# Patient Record
Sex: Female | Born: 1955 | Race: White | Hispanic: No | Marital: Married | Smoking: Never smoker
Health system: Southern US, Community
[De-identification: ages and names within clinical notes are randomized; demographics above are authoritative.]

## PROBLEM LIST (undated history)

## (undated) DIAGNOSIS — M1990 Unspecified osteoarthritis, unspecified site: Secondary | ICD-10-CM

## (undated) DIAGNOSIS — G709 Myoneural disorder, unspecified: Secondary | ICD-10-CM

## (undated) DIAGNOSIS — T7840XA Allergy, unspecified, initial encounter: Secondary | ICD-10-CM

## (undated) DIAGNOSIS — K219 Gastro-esophageal reflux disease without esophagitis: Secondary | ICD-10-CM

## (undated) DIAGNOSIS — E079 Disorder of thyroid, unspecified: Secondary | ICD-10-CM

## (undated) DIAGNOSIS — F419 Anxiety disorder, unspecified: Secondary | ICD-10-CM

## (undated) DIAGNOSIS — IMO0002 Reserved for concepts with insufficient information to code with codable children: Secondary | ICD-10-CM

## (undated) DIAGNOSIS — F32A Depression, unspecified: Secondary | ICD-10-CM

## (undated) DIAGNOSIS — R011 Cardiac murmur, unspecified: Secondary | ICD-10-CM

## (undated) HISTORY — DX: Depression, unspecified: F32.A

## (undated) HISTORY — DX: Reserved for concepts with insufficient information to code with codable children: IMO0002

## (undated) HISTORY — DX: Disorder of thyroid, unspecified: E07.9

## (undated) HISTORY — PX: TUBAL LIGATION: SHX77

## (undated) HISTORY — DX: Allergy, unspecified, initial encounter: T78.40XA

## (undated) HISTORY — DX: Unspecified osteoarthritis, unspecified site: M19.90

## (undated) HISTORY — DX: Myoneural disorder, unspecified: G70.9

## (undated) HISTORY — DX: Cardiac murmur, unspecified: R01.1

## (undated) HISTORY — PX: APPENDECTOMY: SHX54

## (undated) HISTORY — DX: Anxiety disorder, unspecified: F41.9

## (undated) HISTORY — PX: OTHER SURGICAL HISTORY: SHX169

## (undated) HISTORY — DX: Gastro-esophageal reflux disease without esophagitis: K21.9

---

## 1993-01-21 DIAGNOSIS — K219 Gastro-esophageal reflux disease without esophagitis: Secondary | ICD-10-CM

## 1993-01-21 HISTORY — DX: Gastro-esophageal reflux disease without esophagitis: K21.9

## 1995-01-22 HISTORY — PX: ABDOMINAL HYSTERECTOMY: SHX81

## 1998-02-13 ENCOUNTER — Encounter: Admission: RE | Admit: 1998-02-13 | Discharge: 1998-02-22 | Payer: Self-pay | Admitting: *Deleted

## 1999-01-08 ENCOUNTER — Other Ambulatory Visit: Admission: RE | Admit: 1999-01-08 | Discharge: 1999-01-08 | Payer: Self-pay | Admitting: Obstetrics and Gynecology

## 2000-02-15 ENCOUNTER — Other Ambulatory Visit: Admission: RE | Admit: 2000-02-15 | Discharge: 2000-02-15 | Payer: Self-pay | Admitting: Obstetrics and Gynecology

## 2001-11-04 ENCOUNTER — Other Ambulatory Visit: Admission: RE | Admit: 2001-11-04 | Discharge: 2001-11-04 | Payer: Self-pay | Admitting: Obstetrics and Gynecology

## 2002-07-22 ENCOUNTER — Encounter: Admission: RE | Admit: 2002-07-22 | Discharge: 2002-07-22 | Payer: Self-pay | Admitting: Internal Medicine

## 2002-07-22 ENCOUNTER — Encounter: Payer: Self-pay | Admitting: Internal Medicine

## 2003-06-03 ENCOUNTER — Other Ambulatory Visit: Admission: RE | Admit: 2003-06-03 | Discharge: 2003-06-03 | Payer: Self-pay | Admitting: Obstetrics and Gynecology

## 2003-06-10 ENCOUNTER — Encounter: Admission: RE | Admit: 2003-06-10 | Discharge: 2003-06-10 | Payer: Self-pay | Admitting: Obstetrics and Gynecology

## 2004-09-17 ENCOUNTER — Other Ambulatory Visit: Admission: RE | Admit: 2004-09-17 | Discharge: 2004-09-17 | Payer: Self-pay | Admitting: Obstetrics and Gynecology

## 2004-11-14 ENCOUNTER — Ambulatory Visit: Payer: Self-pay | Admitting: Infectious Diseases

## 2005-02-22 ENCOUNTER — Ambulatory Visit: Payer: Self-pay | Admitting: Internal Medicine

## 2005-02-27 ENCOUNTER — Ambulatory Visit: Payer: Self-pay | Admitting: Family Medicine

## 2006-04-28 ENCOUNTER — Ambulatory Visit: Payer: Self-pay | Admitting: Internal Medicine

## 2006-05-29 DIAGNOSIS — Z9089 Acquired absence of other organs: Secondary | ICD-10-CM | POA: Insufficient documentation

## 2006-05-29 DIAGNOSIS — Z9079 Acquired absence of other genital organ(s): Secondary | ICD-10-CM | POA: Insufficient documentation

## 2006-07-04 ENCOUNTER — Encounter: Payer: Self-pay | Admitting: Internal Medicine

## 2006-09-05 ENCOUNTER — Telehealth (INDEPENDENT_AMBULATORY_CARE_PROVIDER_SITE_OTHER): Payer: Self-pay | Admitting: *Deleted

## 2006-10-02 ENCOUNTER — Telehealth (INDEPENDENT_AMBULATORY_CARE_PROVIDER_SITE_OTHER): Payer: Self-pay | Admitting: *Deleted

## 2006-12-03 ENCOUNTER — Telehealth (INDEPENDENT_AMBULATORY_CARE_PROVIDER_SITE_OTHER): Payer: Self-pay | Admitting: *Deleted

## 2007-01-28 ENCOUNTER — Ambulatory Visit: Payer: Self-pay | Admitting: Internal Medicine

## 2007-01-28 DIAGNOSIS — N951 Menopausal and female climacteric states: Secondary | ICD-10-CM | POA: Insufficient documentation

## 2007-01-28 DIAGNOSIS — R9431 Abnormal electrocardiogram [ECG] [EKG]: Secondary | ICD-10-CM | POA: Insufficient documentation

## 2007-01-28 DIAGNOSIS — F32A Depression, unspecified: Secondary | ICD-10-CM | POA: Insufficient documentation

## 2007-01-28 DIAGNOSIS — F329 Major depressive disorder, single episode, unspecified: Secondary | ICD-10-CM

## 2007-01-28 DIAGNOSIS — J309 Allergic rhinitis, unspecified: Secondary | ICD-10-CM | POA: Insufficient documentation

## 2007-04-09 ENCOUNTER — Encounter: Payer: Self-pay | Admitting: Internal Medicine

## 2007-04-09 ENCOUNTER — Ambulatory Visit: Payer: Self-pay | Admitting: Family Medicine

## 2007-04-27 ENCOUNTER — Encounter (INDEPENDENT_AMBULATORY_CARE_PROVIDER_SITE_OTHER): Payer: Self-pay | Admitting: *Deleted

## 2008-01-22 HISTORY — PX: COLONOSCOPY: SHX174

## 2008-02-24 ENCOUNTER — Telehealth (INDEPENDENT_AMBULATORY_CARE_PROVIDER_SITE_OTHER): Payer: Self-pay | Admitting: *Deleted

## 2008-03-21 ENCOUNTER — Telehealth (INDEPENDENT_AMBULATORY_CARE_PROVIDER_SITE_OTHER): Payer: Self-pay | Admitting: *Deleted

## 2008-04-25 ENCOUNTER — Ambulatory Visit: Payer: Self-pay | Admitting: Internal Medicine

## 2008-04-25 DIAGNOSIS — J4520 Mild intermittent asthma, uncomplicated: Secondary | ICD-10-CM | POA: Insufficient documentation

## 2008-04-25 DIAGNOSIS — J45909 Unspecified asthma, uncomplicated: Secondary | ICD-10-CM | POA: Insufficient documentation

## 2008-04-26 ENCOUNTER — Encounter: Payer: Self-pay | Admitting: Internal Medicine

## 2008-07-28 ENCOUNTER — Ambulatory Visit: Payer: Self-pay | Admitting: Family Medicine

## 2008-08-04 ENCOUNTER — Encounter: Payer: Self-pay | Admitting: Internal Medicine

## 2008-09-12 ENCOUNTER — Ambulatory Visit (HOSPITAL_COMMUNITY): Admission: RE | Admit: 2008-09-12 | Discharge: 2008-09-12 | Payer: Self-pay | Admitting: Gastroenterology

## 2008-10-31 ENCOUNTER — Telehealth (INDEPENDENT_AMBULATORY_CARE_PROVIDER_SITE_OTHER): Payer: Self-pay | Admitting: *Deleted

## 2008-11-15 ENCOUNTER — Telehealth (INDEPENDENT_AMBULATORY_CARE_PROVIDER_SITE_OTHER): Payer: Self-pay | Admitting: *Deleted

## 2009-02-13 ENCOUNTER — Emergency Department (HOSPITAL_COMMUNITY): Admission: EM | Admit: 2009-02-13 | Discharge: 2009-02-13 | Payer: Self-pay | Admitting: Emergency Medicine

## 2009-04-28 ENCOUNTER — Ambulatory Visit: Payer: Self-pay | Admitting: Internal Medicine

## 2009-04-28 DIAGNOSIS — IMO0001 Reserved for inherently not codable concepts without codable children: Secondary | ICD-10-CM | POA: Insufficient documentation

## 2009-05-02 ENCOUNTER — Encounter: Payer: Self-pay | Admitting: Internal Medicine

## 2009-05-10 ENCOUNTER — Telehealth (INDEPENDENT_AMBULATORY_CARE_PROVIDER_SITE_OTHER): Payer: Self-pay | Admitting: *Deleted

## 2009-05-10 DIAGNOSIS — M8580 Other specified disorders of bone density and structure, unspecified site: Secondary | ICD-10-CM | POA: Insufficient documentation

## 2009-05-11 ENCOUNTER — Telehealth: Payer: Self-pay | Admitting: Internal Medicine

## 2009-05-15 ENCOUNTER — Ambulatory Visit: Payer: Self-pay | Admitting: Internal Medicine

## 2010-02-18 LAB — CONVERTED CEMR LAB
AST: 16 units/L (ref 0–37)
BUN: 9 mg/dL (ref 6–23)
Basophils Absolute: 0 10*3/uL (ref 0.0–0.1)
Calcium: 9.3 mg/dL (ref 8.4–10.5)
Cholesterol: 167 mg/dL (ref 0–200)
Creatinine, Ser: 0.8 mg/dL (ref 0.4–1.2)
FSH: 15 milliintl units/mL
GFR calc Af Amer: 98 mL/min
HCT: 40.1 % (ref 36.0–46.0)
HDL: 45.9 mg/dL (ref >39.0)
Hemoglobin: 13.8 g/dL (ref 12.0–15.0)
LDL Cholesterol: 108 mg/dL — ABNORMAL HIGH (ref 0–99)
Lymphocytes Relative: 30.3 % (ref 12.0–46.0)
Monocytes Relative: 9.4 % (ref 3.0–11.0)
Neutrophils Relative %: 59.1 % (ref 43.0–77.0)
Platelets: 283 10*3/uL (ref 150–400)
RBC: 4.31 M/uL (ref 3.87–5.11)
TSH: 1.37 microintl units/mL (ref 0.35–5.50)
Total CHOL/HDL Ratio: 3.6
VLDL: 13 mg/dL (ref 0–40)
WBC: 5 10*3/uL (ref 4.5–10.5)
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
## 2010-02-20 NOTE — Progress Notes (Signed)
Summary: Request for labs  Phone Note Call from Patient Call back at Home Phone [phone]   Caller: Patient Summary of Call: Message left on VM: Patient would like to know if we have lab results, she had them drawn at another office. Patient states we should be able to access her records through EMR    I called patient and left a message informing her that as of right now we do not have any lab results, I will futher f/u on this on Monday 05/15/09, we are closed tomorrow for Good Friday./Chrae [facility]  May 11, 2009 4:51 PM   Follow-up for Phone Call        pt return call inquiring about labs. Advise pt of chrae comments will f/u later today...............Marland KitchenFelecia Deloach CMA  May 15, 2009 9:47 AM   Additional Follow-up for Phone Call Additional follow up Details #1::        Excellent results ;hard copy to be mailed Additional Follow-up by: Marga Melnick MD,  May 15, 2009 11:07 AM    Additional Follow-up for Phone Call Additional follow up Details #2::    Patient aware, copy mailed./Chrae [facility]  May 15, 2009 3:37 PM

## 2010-02-20 NOTE — Progress Notes (Signed)
Summary: bone density order  Phone Note Call from Patient   Caller: Patient Summary of Call: Pt states that she was told at her last OV to figure out when her last bone density was. pt call to inform dr hopper that the last one that she had should have been order by our office so we should have record of it. Informed pt that according to our record last Bone Density done 04-09-07 so she is do. pt aware bone density ordered awaiting appt info.............Marland KitchenFelecia Deloach CMA  May 10, 2009 11:05 AM   New Problems: OSTEOPENIA (ICD-733.90)   New Problems: OSTEOPENIA (ICD-733.90)

## 2010-02-20 NOTE — Miscellaneous (Signed)
Summary: BONE DENSITY  Clinical Lists Changes  Orders: Added new Test order of T-Bone Densitometry (77080) - Signed Added new Test order of T-Lumbar Vertebral Assessment (77082) - Signed 

## 2010-05-07 ENCOUNTER — Other Ambulatory Visit: Payer: Self-pay | Admitting: Internal Medicine

## 2010-06-05 NOTE — Op Note (Signed)
Gina Graves, DERINGER           ACCOUNT NO.:  [id number]   MEDICAL RECORD NO.:  [id number]          PATIENT TYPE:  AMB   LOCATION:  ENDO                         FACILITY:  [facility]   PHYSICIAN:  Bernette Redbird, M.D.   DATE OF BIRTH:  21-Jul-1955   DATE OF PROCEDURE:  09/12/2008  DATE OF DISCHARGE:                               OPERATIVE REPORT   PROCEDURE:  Colonoscopy.   INDICATIONS:  A 55 year old female for initial colon cancer screening.  No risk factors for colorectal neoplasia.   FINDINGS:  Minimal melanosis coli.  Otherwise normal exam.   PROCEDURE:  The purpose and risks of the procedure have been reviewed  with the patient who provided written consent, coming as an outpatient  to the [facility] Endo unit because she is a [employer].  Sedation was Phenergan 12.5 mg IV, fentanyl 75 mcg IV and  Versed 7.5 mg IV without arrhythmias or desaturation.  The Pentax adult  video colonoscope was advanced around the colon without too much  difficulty, requiring some external abdominal compression to advance in  the proximal colon.  The cecum was reached and the terminal ileum was  entered for a short distance and it appeared normal, with a moderate  amount of lymphoid hyperplasia.  Pullback was then performed.  The  appendiceal orifice was identified.  The quality of the prep was fairly  good, but not optimal.  Quite a bit of irrigation was needed to clear  some residual stool debris but in the end it is felt that all areas were  adequately seen.   This was a normal examination other than some minimal changes of  melanosis coli.  No polyps, cancer, colitis, vascular malformations or  diverticulosis were noted.  Retroflexion in the rectum and reinspection  of the rectum were unremarkable.  No biopsies were obtained.  The  patient tolerated the procedure well and there no apparent  complications.   IMPRESSION:  Normal initial screening colonoscopy in a standard  risk  individual.   PLAN:  Follow-up colonoscopy in 10 years as per current American [employer]  Society recommendations.           ______________________________  Bernette Redbird, M.D.     RB/MEDQ  D:  09/12/2008  T:  09/12/2008  Job:  [id number]   cc:   Titus Dubin. Alwyn Ren, MD,FACP,FCCP  [phone] [place]  [place] [address]   Duke Salvia. Marcelle Overlie, M.D.  Fax: [phone]

## 2010-06-08 NOTE — Assessment & Plan Note (Signed)
[facility]                        [place] OFFICE NOTE   Gina Graves, Gina Graves                  MRN:          [id number]  DATE:04/28/2006                            DOB:          Oct 22, 1955    Ms. Gina Graves was seen April 28, 2006 for an episode of syncope.   She had gotten out of bed approximately 7 a.m. and walked down the  stairs to let her dog out.  She bent over, leashed the dog, and stood  back up.  This had encompassed approximately 1-2 minutes.  She began to  feel woozy and was attempting to get back to her bed when she passed  out.  She has no definite memory of the event.  She denies any  predisposing factors such as change in heart rhythm or rate.  She denies  any seizure stigmata or stool or urine incontinence.  Subsequent to the  event, she has not had additional loss of consciousness or projectile  vomiting.   She has had a recent cold for approximately a week and a half.  She  has been using NyQuil at night.  She feels she has had some night  sweats, low grade fever, and scant yellow discharge from her nose.  She  denies any associated vertigo or significant balance dysfunction.   Additionally, she has been taking a [employer] preventative nutrition product  Complete Body Cleansing Program,  for 2 days.  This has resulted in  some loose stool.   Her bloating is a chronic problem.  She had seen Dr. Marcelle Overlie, her  gynecologist, in September and there was no ovarian pathology.   Her past history includes total abdominal hysterectomy for a uterine  adenoma and dysmenorrhea.  She had an appendectomy at 55, tonsillectomy  at age 55.  She is gravida 4 para 3.   FAMILY HISTORY:  Positive for breast cancer in mother, myocardial  infarction in the father, myocardial infarction in paternal grandfather  and paternal uncle, diabetes in maternal grandmother.   She is a nonsmoker; SHE IS INTOLERANT ALLERGIC TO WELLBUTRIN.   At this time  she is on trazodone 25 mg 1-2 at bedtime, fluoxetine 40 mg  daily, Zyrtec as needed, Singulair 10 mg daily.  She is also in a GI  study for Dr. Jennye Boroughs office in which she takes Motrin 3 times a day  and Pepcid.  She has endoscopies every 2 months.   Review of systems indicates significant weight gain for which thyroid  function tests were done by Dr. Marcelle Overlie in September, and which were  normal.   She does have bursitis and is on nonsteroidals as noted.   Her weight is up approximately 8 pounds to 169, pulse of 68, respiratory  rate 16, and blood pressure 100/74.  This is in the range of her normal  blood pressure.  The otolaryngologic exam was unremarkable.  Specifically, tympanic  membranes were clear and nares were patent.  Oropharynx is normal with  excellent dental hygiene.  She has no lymphadenopathy about the head, neck or axilla.  Chest was clear on auscultation.  She has a regular  rhythm.  No carotid  bruits are noted.  Cranial nerve exam is normal.  She has no nystagmus.  Strength is good  in all 4 extremities.  Romberg testing is negative. Tuning fork exam is  normal.   There is no evidence of any significant sequelae from the loss of  consciousness.  It most likely represents a postural orthostatic event  related to having just arisen and having been taking a cleansing program  in the context of nonspecific upper respiratory tract infection.   Is is recommended she hold the cleansing program for the time being.  If  she wants to take a natural probiotic such as Align for bloating that  would be appropriate.  It is recommended that she take Amoxicillin if  she has any significant fever, facial pain, or nasal purulence.  It is  recommended she avoid decongestants; she could use a Neti Pot while  sitting leaning over the sink; I would not advise use of this modality  while standing because of this event.   She will be asked to notify us if there are persitant or  progressive  symptoms, particularly loss of consciousness or projectile vomiting.     Titus Dubin. Alwyn Ren, MD,FACP,FCCP  Electronically Signed    [facility]/MedQ  DD: 04/28/2006  DT: 04/28/2006  Job #: [id number]

## 2010-07-02 ENCOUNTER — Encounter: Payer: Self-pay | Admitting: Internal Medicine

## 2010-07-02 ENCOUNTER — Ambulatory Visit (INDEPENDENT_AMBULATORY_CARE_PROVIDER_SITE_OTHER): Payer: 59 | Admitting: Internal Medicine

## 2010-07-02 DIAGNOSIS — L0390 Cellulitis, unspecified: Secondary | ICD-10-CM

## 2010-07-02 DIAGNOSIS — IMO0002 Reserved for concepts with insufficient information to code with codable children: Secondary | ICD-10-CM

## 2010-07-02 MED ORDER — CEPHALEXIN 250 MG PO CAPS: 500.0000 mg | ORAL_CAPSULE | Freq: Two times a day (BID) | ORAL | Status: AC

## 2010-07-02 MED ORDER — KETOCONAZOLE 2 % EX CREA: TOPICAL_CREAM | Freq: Two times a day (BID) | CUTANEOUS | Status: DC

## 2010-07-02 NOTE — Patient Instructions (Signed)
Saline soaks twice a day followed by Nizoral topically as discussed.

## 2010-07-02 NOTE — Progress Notes (Signed)
Addended by: Candie Echevaria L on: 07/02/2010 05:10 PM   Modules accepted: Orders

## 2010-07-02 NOTE — Progress Notes (Signed)
  Subjective:    Patient ID: Gina Graves, female    DOB: 13-May-1955, 55 y.o.   MRN: [id number]  HPI Extremity pain Onset:last night , woke her Trigger/injury:no except fungal nail changes Pain quality:throbbing with direst pressure Pain severity:up to 4 Duration:seconds Radiation:no Exacerbating factors:any weight bearing or pressure Review of systems: Constitutional: fever, chills, sweats, change in weight :no Musculoskeletal: muscle cramp or pain:no;  joint stiffness, redness, or swelling:no Skin:rash, color change:no Neuro:: numbness and tingling: this am Heme:lymphadenopathy: no; abnormal bleeding or clotting:no Treatment/response:none  no PMH of gout but her mother did    Review of Systems     Objective:   Physical Exam General appearance is one of good health and nourishment.   Pedal pulses intact without  bruits .No ischemic skin changes.  Skin:  Intact without suspicious lesions or rashes but erythema of 5th L toe. Chronic fungal changes of nail          Assessment & Plan:  #1 cellulitis #2 paronychia Plan:Keflex ; Nizoral

## 2010-08-13 ENCOUNTER — Other Ambulatory Visit: Payer: Self-pay | Admitting: Internal Medicine

## 2010-08-22 ENCOUNTER — Encounter: Payer: Self-pay | Admitting: Internal Medicine

## 2010-08-22 ENCOUNTER — Ambulatory Visit (INDEPENDENT_AMBULATORY_CARE_PROVIDER_SITE_OTHER): Payer: 59 | Admitting: Internal Medicine

## 2010-08-22 VITALS — BP 112/76 | HR 75 | Temp 98.7°F | Resp 14 | Ht 67.0 in | Wt 174.6 lb

## 2010-08-22 DIAGNOSIS — J45909 Unspecified asthma, uncomplicated: Secondary | ICD-10-CM

## 2010-08-22 DIAGNOSIS — M899 Disorder of bone, unspecified: Secondary | ICD-10-CM

## 2010-08-22 DIAGNOSIS — M949 Disorder of cartilage, unspecified: Secondary | ICD-10-CM

## 2010-08-22 DIAGNOSIS — K449 Diaphragmatic hernia without obstruction or gangrene: Secondary | ICD-10-CM

## 2010-08-22 DIAGNOSIS — Z0000 Encounter for general adult medical examination without abnormal findings: Secondary | ICD-10-CM

## 2010-08-22 DIAGNOSIS — Z23 Encounter for immunization: Secondary | ICD-10-CM

## 2010-08-22 MED ORDER — ALBUTEROL SULFATE HFA 108 (90 BASE) MCG/ACT IN AERS: 2.0000 | INHALATION_SPRAY | RESPIRATORY_TRACT | Status: DC | PRN

## 2010-08-22 MED ORDER — FLUTICASONE-SALMETEROL 100-50 MCG/DOSE IN AEPB: 1.0000 | INHALATION_SPRAY | Freq: Two times a day (BID) | RESPIRATORY_TRACT | Status: DC

## 2010-08-22 MED ORDER — OMEPRAZOLE 20 MG PO CPDR: 20.0000 mg | DELAYED_RELEASE_CAPSULE | Freq: Two times a day (BID) | ORAL | Status: DC

## 2010-08-22 MED ORDER — SINGULAIR 10 MG PO TABS: 10.0000 mg | ORAL_TABLET | Freq: Every day | ORAL | Status: DC

## 2010-08-22 MED ORDER — METRONIDAZOLE 1 % EX GEL: Freq: Every day | CUTANEOUS | Status: AC

## 2010-08-22 NOTE — Assessment & Plan Note (Signed)
Flare only with RTI; well controlled with maintenance agents

## 2010-08-22 NOTE — Patient Instructions (Signed)
The triggers for HH/ reflux  include stress; the "aspirin family" ; alcohol; peppermint; and caffeine (coffee, tea, cola, and chocolate). The aspirin family would include aspirin and the nonsteroidal agents such as ibuprofen &  Naproxen. Tylenol would not cause reflux.Food & drink should be avoided for @ least 2 hours before going to bed.

## 2010-08-22 NOTE — Progress Notes (Signed)
Subjective:    Patient ID: Gina Graves, female    DOB: 20-Jul-1955, 55 y.o.   MRN: [id number]  HPI  Gina Batten  is here for a physical;acute issues include GERD flare with weight machine exercises.      Review of Systems Patient reports no vision/ hearing  changes, adenopathy,fever, weight change,  persistant / recurrent hoarseness , swallowing issues, exertional chest pain,palpitations,edema,persistant /recurrent cough, hemoptysis, dyspnea( rest/ exertional/paroxysmal nocturnal), gastrointestinal bleeding(melena, rectal bleeding), abdominal pain, significant heartburn,  bowel changes,GU symptoms(dysuria, hematuria,pyuria, incontinence) ), Gyn symptoms(abnormal  bleeding , pain),  syncope, focal weakness, memory loss,numbness & tingling, skin/hair /nail changes,abnormal bruising or bleeding, or anxiety.Meds from  Dr Madaline Guthrie  control  depression.  Extensive labs were done through [facility] hospital employee services in the fall; these were all normal by history. Copies will be obtained for scanning. The epigastric/substernal symptoms are discomfort which is positional or with  straining. Certain foods ( bananas, popcorn) may also cause an issue. Protein pump inhibitor was not of benefit. She does have a remote history of hiatal hernia.     Objective:   Physical Exam Gen.: Healthy and well-nourished in appearance. Alert, appropriate and cooperative throughout exam. Head: Normocephalic without obvious abnormalities Eyes: No corneal or conjunctival inflammation noted. Pupils equal round reactive to light and accommodation. Fundal exam is benign without hemorrhages, exudate, papilledema. Extraocular motion intact. Vision grossly normal. Ears: External  ear exam reveals no significant lesions or deformities. Canals clear .TMs normal. Hearing is grossly normal bilaterally. Nose: External nasal exam reveals no deformity or inflammation. Nasal mucosa are pink and moist. No lesions or exudates noted. Septum   normal  Mouth: Oral mucosa and oropharynx reveal no lesions or exudates. Teeth in good repair. Neck: No deformities, masses, or tenderness noted. Range of motion &. Thyroid normal ( physiologic asymmetry). Lungs: Normal respiratory effort; chest expands symmetrically. Lungs are clear to auscultation without rales, wheezes, or increased work of breathing. Heart: Normal rate and rhythm. Normal S1 and S2. No gallop, click, or rub. S4 w/o  murmur. Abdomen: Bowel sounds normal; abdomen soft and nontender. No masses, organomegaly or hernias noted. Genitalia: Dr Marcelle Overlie   .                                                                                   Musculoskeletal/extremities: No deformity or scoliosis noted of  the thoracic or lumbar spine. No clubbing, cyanosis, edema, or deformity noted. Range of motion  normal .Tone & strength  normal.Joints normal. Nail health  good. Vascular: Carotid, radial artery, dorsalis pedis and  posterior tibial pulses are full and equal. No bruits present. Neurologic: Alert and oriented x3. Deep tendon reflexes symmetrical and normal.          Skin: Intact without suspicious lesions or rashes. Lymph: No cervical, axillary lymphadenopathy present. Psych: Mood and affect are normal. Normally interactive  Assessment & Plan:  #1 comprehensive physical exam; no acute findings #2 see Problem List with Assessments & Recommendations #3 Hiatal Hernia exacerbation after weight exercises Plan: see Orders

## 2010-08-24 ENCOUNTER — Encounter: Payer: Self-pay | Admitting: Internal Medicine

## 2010-08-24 ENCOUNTER — Telehealth: Payer: Self-pay | Admitting: Internal Medicine

## 2010-08-24 NOTE — Telephone Encounter (Signed)
Per Dexa order, patient's last Bone Density was 04/2009 @ [facility].  Too soon for Insurance to cover another.

## 2011-02-11 ENCOUNTER — Ambulatory Visit: Payer: 59 | Attending: Sports Medicine | Admitting: Physical Therapy

## 2011-02-11 DIAGNOSIS — M25569 Pain in unspecified knee: Secondary | ICD-10-CM | POA: Insufficient documentation

## 2011-02-11 DIAGNOSIS — M25669 Stiffness of unspecified knee, not elsewhere classified: Secondary | ICD-10-CM | POA: Insufficient documentation

## 2011-02-11 DIAGNOSIS — IMO0001 Reserved for inherently not codable concepts without codable children: Secondary | ICD-10-CM | POA: Insufficient documentation

## 2011-02-13 ENCOUNTER — Other Ambulatory Visit: Payer: Self-pay | Admitting: Internal Medicine

## 2011-02-13 NOTE — Telephone Encounter (Signed)
I called the pharmacy, rx was filled in August for year supply. Per pharmacy ? If of to switch to generic, generic ok'd per Dr.Hopper.

## 2011-02-14 ENCOUNTER — Ambulatory Visit: Payer: 59 | Admitting: Physical Therapy

## 2011-02-18 ENCOUNTER — Ambulatory Visit: Payer: 59 | Admitting: Physical Therapy

## 2011-02-21 ENCOUNTER — Ambulatory Visit: Payer: 59 | Admitting: Physical Therapy

## 2011-11-12 ENCOUNTER — Ambulatory Visit (INDEPENDENT_AMBULATORY_CARE_PROVIDER_SITE_OTHER): Payer: Self-pay | Admitting: Family Medicine

## 2011-11-12 DIAGNOSIS — Z713 Dietary counseling and surveillance: Secondary | ICD-10-CM

## 2011-11-14 ENCOUNTER — Ambulatory Visit (INDEPENDENT_AMBULATORY_CARE_PROVIDER_SITE_OTHER): Payer: 59 | Admitting: Internal Medicine

## 2011-11-14 ENCOUNTER — Encounter: Payer: Self-pay | Admitting: Internal Medicine

## 2011-11-14 VITALS — BP 122/76 | HR 94 | Temp 97.7°F | Resp 12 | Wt 176.2 lb

## 2011-11-14 DIAGNOSIS — Z0000 Encounter for general adult medical examination without abnormal findings: Secondary | ICD-10-CM

## 2011-11-14 DIAGNOSIS — J45909 Unspecified asthma, uncomplicated: Secondary | ICD-10-CM

## 2011-11-14 DIAGNOSIS — R9431 Abnormal electrocardiogram [ECG] [EKG]: Secondary | ICD-10-CM

## 2011-11-14 DIAGNOSIS — M899 Disorder of bone, unspecified: Secondary | ICD-10-CM

## 2011-11-14 NOTE — Progress Notes (Signed)
Subjective:    Patient ID: Gina Graves, female    DOB: 1955/02/11, 55 y.o.   MRN: [id number]  HPI  Gina Graves is here for a physical;acute issues intermittent joint discomfort which will last for weeks; it has been worse in the morning. This has been a recurring problem for years.      Review of Systems She exercises on average 2 times a week for 30 minutes. Her diet is described as heart healthy. She denies chest pain, palpitations, dyspnea, or claudication other than exercise-induced bronchospasm controlled with albuterol. There is a strong family history of premature coronary disease in the paternal family.  For > 1 week she's had some upper respiratory tract symptoms with maxillary area congestion postnasal drainage. She is not having significant frontal headache, facial pain, nasal purulence, or fever.    Objective:   Physical Exam  Gen.: Healthy and well-nourished in appearance. Alert, appropriate and cooperative throughout exam. Head: Normocephalic without obvious abnormalities  Eyes: No corneal or conjunctival inflammation noted. Pupils equal round reactive to light and accommodation. Fundal exam is benign without hemorrhages, exudate, papilledema. Extraocular motion intact. Vision grossly normal. Ears: External  ear exam reveals no significant lesions or deformities. Canals clear .TMs normal. Hearing is grossly normal bilaterally. Nose: External nasal exam reveals no deformity or inflammation. Nasal mucosa are pink and moist. No lesions or exudates noted.  Mouth: Oral mucosa and oropharynx reveal no lesions or exudates. Teeth in good repair. Neck: No deformities, masses, or tenderness noted. Range of motion & Thyroid normal. Lungs: Normal respiratory effort; chest expands symmetrically. Lungs are clear to auscultation without rales, wheezes, or increased work of breathing. Heart: Normal rate and rhythm. Normal S1 and S2. No gallop, click, or rub. No murmur. Abdomen:  Bowel sounds normal; abdomen soft and nontender. No masses, organomegaly or hernias noted. Genitalia: Dr Marcelle Overlie, Gyn                                                                           Musculoskeletal/extremities: No deformity or scoliosis noted of  the thoracic or lumbar spine. No clubbing, cyanosis, edema, or deformity noted. Range of motion  normal .Tone & strength  normal.Joints normal. Nail health  good. Vascular: Carotid, radial artery, dorsalis pedis and  posterior tibial pulses are full and equal. No bruits present. Neurologic: Alert and oriented x3. Deep tendon reflexes symmetrical and normal.          Skin: Intact without suspicious lesions or rashes. Lymph: No cervical, axillary lymphadenopathy present. Psych: Mood and affect are normal. Normally interactive                                                                                       Assessment & Plan:  #1 comprehensive physical exam; no acute findings #2 rhinitis   #3 intermittent myalgias/arthralgias, not active at this time  #4 nonspecific  EKG changes, and no significant progression. She is asymptomatic Plan: see Orders

## 2011-11-14 NOTE — Patient Instructions (Addendum)
Preventive Health Care: Exercise  30-45  minutes a day, 3-4 days a week. Walking is especially valuable in preventing Osteoporosis. Eat a low-fat diet with lots of fruits and vegetables, up to 7-9 servings per day.  Consume less than 30 grams of sugar per day from foods & drinks with High Fructose Corn Syrup as #1,2,3 or #4 on label. Health Care Power of Attorney & Living Will place you in charge of your health care  decisions. Verify these are  in place. Plain Mucinex for thick secretions ;force NON dairy fluids . Use a Neti pot daily as needed for sinus congestion; going from open side to congested side . Nasal cleansing in the shower as discussed. Make sure that all residual soap is removed to prevent irritation. Nasonex (sampled) 1 spray in each nostril twice a day as needed. Use the "crossover" technique as discussed. Plain Allegra 160 daily as needed for itchy eyes & sneezing. Take the EKG to any emergency room or preop visits. There are nonspecific changes; as long as there is no new change these are not clinically significant.      Please  schedule fasting Labs : BMET,Lipids, hepatic panel, CBC & dif, TSH. PLEASE TAKE THESE INSTRUCTIONS TO FOLLOW UP  LAB APPOINTMENT.This will guarantee correct labs are drawn, eliminating need for repeat blood sampling ( needle sticks ! ). Diagnoses /Codes: V70.0.   If you activate My Chart; the results can be released to you as soon as they populate from the lab. If you choose not to use this program; the labs have to be reviewed, copied & mailed causing a delay in getting the results to you.

## 2011-11-15 ENCOUNTER — Other Ambulatory Visit (INDEPENDENT_AMBULATORY_CARE_PROVIDER_SITE_OTHER): Payer: 59

## 2011-11-15 DIAGNOSIS — Z0000 Encounter for general adult medical examination without abnormal findings: Secondary | ICD-10-CM

## 2011-11-15 LAB — LIPID PANEL
Total CHOL/HDL Ratio: 3
Triglycerides: 56 mg/dL (ref 0.0–149.0)

## 2011-11-15 LAB — CBC WITH DIFFERENTIAL/PLATELET
Basophils Absolute: 0 10*3/uL (ref 0.0–0.1)
Eosinophils Absolute: 0.1 10*3/uL (ref 0.0–0.7)
HCT: 41.1 % (ref 36.0–46.0)
Lymphs Abs: 1.6 10*3/uL (ref 0.7–4.0)
MCHC: 32.8 g/dL (ref 30.0–36.0)
MCV: 94 fl (ref 78.0–100.0)
Monocytes Absolute: 0.3 10*3/uL (ref 0.1–1.0)
Platelets: 260 10*3/uL (ref 150.0–400.0)
RDW: 13.3 % (ref 11.5–14.6)

## 2011-11-15 LAB — BASIC METABOLIC PANEL
CO2: 30 mEq/L (ref 19–32)
Calcium: 8.7 mg/dL (ref 8.4–10.5)
Creatinine, Ser: 0.9 mg/dL (ref 0.4–1.2)

## 2011-11-15 LAB — HEPATIC FUNCTION PANEL
AST: 14 U/L (ref 0–37)
Albumin: 3.7 g/dL (ref 3.5–5.2)
Alkaline Phosphatase: 34 U/L — ABNORMAL LOW (ref 39–117)
Bilirubin, Direct: 0 mg/dL (ref 0.0–0.3)
Total Bilirubin: 0.7 mg/dL (ref 0.3–1.2)

## 2011-11-15 LAB — TSH: TSH: 1.31 u[IU]/mL (ref 0.35–5.50)

## 2011-11-18 ENCOUNTER — Encounter: Payer: Self-pay | Admitting: Internal Medicine

## 2011-11-18 ENCOUNTER — Other Ambulatory Visit: Payer: Self-pay | Admitting: Internal Medicine

## 2011-11-19 ENCOUNTER — Encounter: Payer: Self-pay | Admitting: Internal Medicine

## 2011-11-19 ENCOUNTER — Other Ambulatory Visit: Payer: Self-pay

## 2011-11-19 MED ORDER — FLUTICASONE PROPIONATE 50 MCG/ACT NA SUSP: NASAL | Status: DC

## 2011-11-20 ENCOUNTER — Other Ambulatory Visit: Payer: Self-pay | Admitting: Internal Medicine

## 2011-11-20 MED ORDER — ALBUTEROL SULFATE HFA 108 (90 BASE) MCG/ACT IN AERS: 2.0000 | INHALATION_SPRAY | RESPIRATORY_TRACT | Status: DC | PRN

## 2011-11-20 NOTE — Telephone Encounter (Signed)
refill ventionlin hfa 90 mcg inhaler #18 wt/2-refills Inhale 2 puffs into lungs every 4 hours as needed -- last fill 8.1.12--last ov 10.24.13 V70

## 2012-02-21 ENCOUNTER — Encounter: Payer: Self-pay | Admitting: Internal Medicine

## 2012-02-24 ENCOUNTER — Encounter: Payer: Self-pay | Admitting: Internal Medicine

## 2012-02-26 ENCOUNTER — Encounter: Payer: Self-pay | Admitting: Internal Medicine

## 2012-02-26 ENCOUNTER — Ambulatory Visit (INDEPENDENT_AMBULATORY_CARE_PROVIDER_SITE_OTHER): Payer: 59 | Admitting: Internal Medicine

## 2012-02-26 VITALS — BP 112/70 | HR 80 | Temp 98.5°F | Wt 169.2 lb

## 2012-02-26 DIAGNOSIS — J209 Acute bronchitis, unspecified: Secondary | ICD-10-CM

## 2012-02-26 DIAGNOSIS — M5412 Radiculopathy, cervical region: Secondary | ICD-10-CM

## 2012-02-26 MED ORDER — AZITHROMYCIN 250 MG PO TABS: ORAL_TABLET | ORAL | Status: DC
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
## 2012-02-26 NOTE — Patient Instructions (Addendum)
Review and correct the record as indicated. Please share record with all medical staff seen. Plain Mucinex (NOT D) for thick secretions ;force NON dairy fluids .   Nasal cleansing in the shower as discussed with lather of mild shampoo.After 10 seconds wash off lather while  exhaling through nostrils. Make sure that all residual soap is removed to prevent irritation.  Fluticasone 1 spray in each nostril twice a day as needed. Use the "crossover" technique into opposite nostril spraying toward opposite ear @ 45 degree angle, not straight up into nostril.  Use a Neti pot daily only  as needed for significant sinus congestion; going from open side to congested side . Plain Allegra (NOT D )  160 daily , Loratidine 10 mg , OR Zyrtec 10 mg @ bedtime  as needed for itchy eyes & sneezing. Advair one inhalation every 12 hours; gargle and spit after use

## 2012-03-25 ENCOUNTER — Other Ambulatory Visit: Payer: Self-pay | Admitting: Internal Medicine

## 2012-08-27 ENCOUNTER — Encounter: Payer: Self-pay | Admitting: Internal Medicine

## 2012-08-27 ENCOUNTER — Ambulatory Visit (INDEPENDENT_AMBULATORY_CARE_PROVIDER_SITE_OTHER): Payer: 59 | Admitting: Internal Medicine

## 2012-08-27 VITALS — BP 130/78 | HR 91 | Temp 98.3°F | Wt 166.0 lb

## 2012-08-27 DIAGNOSIS — M940 Chondrocostal junction syndrome [Tietze]: Secondary | ICD-10-CM

## 2012-08-27 DIAGNOSIS — J209 Acute bronchitis, unspecified: Secondary | ICD-10-CM

## 2012-08-27 MED ORDER — AZITHROMYCIN 250 MG PO TABS: ORAL_TABLET | ORAL | Status: DC

## 2012-08-27 NOTE — Progress Notes (Signed)
  Subjective:    Patient ID: Gina Graves, female    DOB: 12-Nov-1955, 57 y.o.   MRN: [id number]  HPI   Symptoms began approximately 2 months ago as a "cold" which resolved over 3 weeks. She's had residual loose but nonproductive cough intermittently. She subsequently noted some soreness in the right upper parasternal area with some inspiratory discomfort  She's also had intermittent left maxillary pressure discomfort; sore throat; sneezing; and shortness of breath. She increased her Advair dosage was some response.    Review of Systems  She denies associated fever, chills, or sweats. She's had no nasal purulence,dental pain, earache, or otic discharge.  She denies associated wheezing.     Objective:   Physical Exam General appearance:good health ;well nourished; no acute distress or increased work of breathing is present.  No  lymphadenopathy about the head, neck, or axilla noted.   Eyes: No conjunctival inflammation or lid edema is present.  Ears:  External ear exam shows no significant lesions or deformities.  Otoscopic examination reveals clear canals, tympanic membranes are intact bilaterally without bulging, retraction, inflammation or discharge.  Nose:  External nasal examination shows no deformity or inflammation. Nasal mucosa are pink and moist without lesions or exudates. No septal dislocation or deviation.No obstruction to airflow.   Oral exam: Dental hygiene is good; lips and gums are healthy appearing.There is mild oropharyngeal erythema ; no exudate noted.   Neck:  No deformities,  masses, or tenderness noted.      Heart:  Normal rate and regular rhythm. S1 and S2 normal without gallop, murmur, click, rub or other extra sounds.   Lungs:Chest clear to auscultation; no wheezes, rhonchi,rales ,or rubs present.No increased work of breathing.    She has localized costochondral tenderness to palpation over the mid to upper right parasternal area  Extremities:  No  cyanosis, edema, or clubbing  noted    Skin: Warm & dry          Assessment & Plan:  #1 protracted bronchitis  #2 costochondritis secondary to coughing  Plan: See orders and recommendations

## 2012-08-27 NOTE — Patient Instructions (Addendum)
Use an anti-inflammatory cream such as Aspercreme or Zostrix cream twice a day to the R parasternal area as needed. In lieu of this warm moist compresses or  hot water bottle can be used. Do not apply ice .Take Aleve one to 2 every 8-12 hours with food as needed. Consider glucosamine sulfate 1500 mg daily for costrochondritis symptoms. Take this daily  for 4-6 weeks . This will rehydrate the cartilages.

## 2012-11-26 ENCOUNTER — Other Ambulatory Visit: Payer: Self-pay

## 2012-11-30 ENCOUNTER — Other Ambulatory Visit: Payer: Self-pay | Admitting: Internal Medicine

## 2012-11-30 NOTE — Telephone Encounter (Signed)
Montelukast refill sent to [facility]

## 2013-02-01 ENCOUNTER — Other Ambulatory Visit: Payer: Self-pay | Admitting: Internal Medicine

## 2013-02-01 NOTE — Telephone Encounter (Signed)
Advair diskus refilled per protocol. JG//CMA 

## 2013-08-09 ENCOUNTER — Encounter: Payer: Self-pay | Admitting: Internal Medicine

## 2013-08-09 ENCOUNTER — Ambulatory Visit (INDEPENDENT_AMBULATORY_CARE_PROVIDER_SITE_OTHER): Payer: 59 | Admitting: Internal Medicine

## 2013-08-09 VITALS — BP 118/76 | HR 83 | Temp 98.2°F | Wt 172.4 lb

## 2013-08-09 DIAGNOSIS — J4541 Moderate persistent asthma with (acute) exacerbation: Secondary | ICD-10-CM

## 2013-08-09 DIAGNOSIS — J45901 Unspecified asthma with (acute) exacerbation: Secondary | ICD-10-CM

## 2013-08-09 MED ORDER — PREDNISONE 20 MG PO TABS: 20.0000 mg | ORAL_TABLET | Freq: Two times a day (BID) | ORAL | Status: DC

## 2013-08-09 MED ORDER — BUDESONIDE-FORMOTEROL FUMARATE 160-4.5 MCG/ACT IN AERO: 2.0000 | INHALATION_SPRAY | Freq: Two times a day (BID) | RESPIRATORY_TRACT | Status: DC

## 2013-08-09 NOTE — Progress Notes (Signed)
Pre visit review using our clinic review tool, if applicable. No additional management support is needed unless otherwise documented below in the visit note. 

## 2013-08-09 NOTE — Patient Instructions (Signed)
Temporarily stop the following medications until notified to restart these: Advair   Fill the  prescription for Prednisone it there is not dramatic improvement in the next  48-72 hours.  Symbicort one - two inhalations every 12 hours; gargle and spit after use

## 2013-08-09 NOTE — Progress Notes (Signed)
   Subjective:    Patient ID: Gina Graves, female    DOB: 1955/11/08, 58 y.o.   MRN: [id number]  HPI   She's had asthma  symptoms for the last few weeks but this has gotten progressively worse with the environmental heat and humidity changes.  This is described as exertional dyspnea with associated fatigue, nonproductive cough, chest tightness  She walked for about 20 minutes and became very short of breath requiring that she rest. It was very humid that day but not significantly hot.  She has awakened at night with shortness of breath and has used her albuterol rescue inhaler on average once daily  She describes frontal and maxillary sinus pressure.  She did get a massage 7/18 with some benefit  She has been using her low-dose Advair one inflation up to 12 hours    Review of Systems  She specifically denies frontal and maxillary sinus pain. She also has no nasal purulence. She has no dental pain, otic pain, otic discharge  She has not noted wheezing.  She has no fever, chills, or sweats  She also denies itchy, watery eyes, or sneezing.     Objective:   Physical Exam   General appearance:good health ;well nourished; no acute distress or increased work of breathing is present.  No  lymphadenopathy about the head, neck, or axilla noted.   Eyes: No conjunctival inflammation or lid edema is present. There is no scleral icterus.  Ears:  External ear exam shows no significant lesions or deformities.  Otoscopic examination reveals clear canals, tympanic membranes are intact bilaterally without bulging, retraction, inflammation or discharge.  Nose:  External nasal examination shows no deformity or inflammation. Nasal mucosa are pink and moist without lesions or exudates. No septal dislocation or deviation.No obstruction to airflow.   Oral exam: Dental hygiene is good; lips and gums are healthy appearing.There is no oropharyngeal erythema or exudate noted.   Neck:  No  deformities, thyromegaly, masses, or tenderness noted.   Supple with full range of motion without pain.   Heart:  Normal rate and regular rhythm. S1 and S2 normal without gallop, murmur, click, rub or other extra sounds.   Lungs:Chest clear to auscultation; no wheezes, rhonchi,rales ,or rubs present.No increased work of breathing.    Extremities:  No cyanosis, edema, or clubbing  noted    Skin: Warm & dry w/o jaundice or tenting.        Assessment & Plan:  #1 asthma exacerbation  See plan and orders

## 2013-08-31 ENCOUNTER — Encounter: Payer: Self-pay | Admitting: Internal Medicine

## 2013-12-30 ENCOUNTER — Other Ambulatory Visit: Payer: Self-pay | Admitting: Internal Medicine

## 2014-02-28 ENCOUNTER — Other Ambulatory Visit: Payer: Self-pay | Admitting: Internal Medicine

## 2014-03-03 ENCOUNTER — Ambulatory Visit (INDEPENDENT_AMBULATORY_CARE_PROVIDER_SITE_OTHER): Payer: 59 | Admitting: Internal Medicine

## 2014-03-03 ENCOUNTER — Encounter: Payer: Self-pay | Admitting: Internal Medicine

## 2014-03-03 VITALS — BP 118/74 | HR 80 | Temp 97.6°F | Resp 15 | Ht 65.5 in | Wt 172.1 lb

## 2014-03-03 DIAGNOSIS — Z0000 Encounter for general adult medical examination without abnormal findings: Secondary | ICD-10-CM

## 2014-03-03 DIAGNOSIS — J4520 Mild intermittent asthma, uncomplicated: Secondary | ICD-10-CM

## 2014-03-03 DIAGNOSIS — Z8249 Family history of ischemic heart disease and other diseases of the circulatory system: Secondary | ICD-10-CM

## 2014-03-03 DIAGNOSIS — J3089 Other allergic rhinitis: Secondary | ICD-10-CM

## 2014-03-03 DIAGNOSIS — Z0189 Encounter for other specified special examinations: Secondary | ICD-10-CM

## 2014-03-03 NOTE — Progress Notes (Signed)
   Subjective:    Patient ID: Gina Graves, female    DOB: 1955/06/20, 59 y.o.   MRN: [id number]  HPI  She is here for a physical;acute issues intermittent fatigue ,which she questions may be stress related   She is on a heart healthy diet. She typically exercises once a week. She has no cardiopulmonary symptoms with this.  Her asthma has been well controlled on one inhalation of Symbicort daily. She rarely uses the rescue inhaler.  Reflux is quiescent as well. She rarely does have some dysphagia if supine after eating & takes a protein pump inhibitor only as needed.  Colonoscopy is up-to-date.No other GI symptoms.  Review of Systems  Chest pain, palpitations, tachycardia, exertional dyspnea, paroxysmal nocturnal dyspnea, claudication or edema are absent.  Unexplained weight loss, abdominal pain, significant dyspepsia, melena, rectal bleeding, or persistently small caliber stools are denied.     Objective:   Physical Exam  Gen.: Adequately nourished in appearance. Alert, appropriate and cooperative throughout exam.  Appears younger than stated age  Head: Normocephalic without obvious abnormalities  Eyes: No corneal or conjunctival inflammation noted. Pupils equal round reactive to light and accommodation. Extraocular motion intact.  Ears: External  ear exam reveals no significant lesions or deformities. Canals clear .TMs normal. Hearing is grossly normal bilaterally. Nose: External nasal exam reveals no deformity or inflammation. Nasal mucosa are pink and moist. No lesions or exudates noted.   Mouth: Oral mucosa and oropharynx reveal no lesions or exudates. Teeth in good repair. Neck: No deformities, masses, or tenderness noted. Range of motion  & Thyroid normal. Lungs: Normal respiratory effort; chest expands symmetrically. Lungs are clear to auscultation without rales, wheezes, or increased work of breathing. Heart: Normal rate and rhythm. Normal S1 and S2. No gallop, click,  or rub. S4 w/o murmur. Abdomen: Bowel sounds normal; abdomen soft and nontender. No masses, organomegaly or hernias noted. Genitalia:as per Gyn                                  Musculoskeletal/extremities: No deformity or scoliosis noted of  the thoracic or lumbar spine. No clubbing, cyanosis, edema, or significant extremity  deformity noted.  Range of motion normal . Tone & strength normal. Hand joints normal  Fingernail  health good. Crepitus of knees  Able to lie down & sit up w/o help.  Negative SLR bilaterally Vascular: Carotid, radial artery, dorsalis pedis and  posterior tibial pulses are full and equal. No bruits present. Neurologic: Alert and oriented x3. Deep tendon reflexes symmetrical and normal.  Gait normal   Skin: Intact without suspicious lesions or rashes.Slightly damp Lymph: No cervical, axillary lymphadenopathy present. Psych: Mood and affect are normal. Normally interactive                                                                                      Assessment & Plan:  #1 comprehensive physical exam; no acute findings  Plan: see Orders  & Recommendations

## 2014-03-03 NOTE — Patient Instructions (Signed)
Reflux of gastric acid may be asymptomatic as this may occur mainly during sleep.The triggers for reflux  include stress; the "aspirin family" ; alcohol; peppermint; and caffeine (coffee, tea, cola, and chocolate). The aspirin family would include aspirin and the nonsteroidal agents such as ibuprofen &  Naproxen. Tylenol would not cause reflux. If having symptoms ; food & drink should be avoided for @ least 2 hours before going to bed.    Plain Mucinex (NOT D) for thick secretions ;force NON dairy fluids .   Nasal cleansing in the shower as discussed with lather of mild shampoo.After 10 seconds wash off lather while  exhaling through nostrils. Make sure that all residual soap is removed to prevent irritation.  Flonase OR Nasacort AQ 1 spray in each nostril twice a day as needed. Use the "crossover" technique into opposite nostril spraying toward opposite ear @ 45 degree angle, not straight up into nostril.  Plain Allegra (NOT D )  160 daily , Loratidine 10 mg , OR Zyrtec 10 mg @ bedtime  as needed for itchy eyes & sneezing.

## 2014-03-03 NOTE — Progress Notes (Signed)
Pre visit review using our clinic review tool, if applicable. No additional management support is needed unless otherwise documented below in the visit note. 

## 2014-03-04 DIAGNOSIS — Z8249 Family history of ischemic heart disease and other diseases of the circulatory system: Secondary | ICD-10-CM | POA: Insufficient documentation

## 2014-03-09 ENCOUNTER — Other Ambulatory Visit: Payer: Self-pay | Admitting: Internal Medicine

## 2014-03-09 ENCOUNTER — Other Ambulatory Visit (INDEPENDENT_AMBULATORY_CARE_PROVIDER_SITE_OTHER): Payer: 59

## 2014-03-09 DIAGNOSIS — Z0189 Encounter for other specified special examinations: Secondary | ICD-10-CM

## 2014-03-09 DIAGNOSIS — Z0000 Encounter for general adult medical examination without abnormal findings: Secondary | ICD-10-CM

## 2014-03-09 LAB — CBC WITH DIFFERENTIAL/PLATELET
Basophils Absolute: 0 10*3/uL (ref 0.0–0.1)
Basophils Relative: 0.4 % (ref 0.0–3.0)
EOS PCT: 1.3 % (ref 0.0–5.0)
Eosinophils Absolute: 0.1 10*3/uL (ref 0.0–0.7)
HEMATOCRIT: 40.9 % (ref 36.0–46.0)
Hemoglobin: 13.8 g/dL (ref 12.0–15.0)
LYMPHS ABS: 1.8 10*3/uL (ref 0.7–4.0)
Lymphocytes Relative: 28.6 % (ref 12.0–46.0)
MCHC: 33.7 g/dL (ref 30.0–36.0)
MCV: 90.3 fl (ref 78.0–100.0)
MONO ABS: 0.4 10*3/uL (ref 0.1–1.0)
Monocytes Relative: 6.9 % (ref 3.0–12.0)
Neutro Abs: 3.9 10*3/uL (ref 1.4–7.7)
Neutrophils Relative %: 62.8 % (ref 43.0–77.0)
Platelets: 274 10*3/uL (ref 150.0–400.0)
RBC: 4.53 Mil/uL (ref 3.87–5.11)
RDW: 13.9 % (ref 11.5–15.5)
WBC: 6.3 10*3/uL (ref 4.0–10.5)

## 2014-03-09 LAB — HEPATIC FUNCTION PANEL
ALT: 11 U/L (ref 0–35)
AST: 14 U/L (ref 0–37)
Albumin: 3.9 g/dL (ref 3.5–5.2)
Alkaline Phosphatase: 39 U/L (ref 39–117)
BILIRUBIN DIRECT: 0.1 mg/dL (ref 0.0–0.3)
Total Bilirubin: 0.7 mg/dL (ref 0.2–1.2)
Total Protein: 6.4 g/dL (ref 6.0–8.3)

## 2014-03-09 LAB — BASIC METABOLIC PANEL
BUN: 13 mg/dL (ref 6–23)
CHLORIDE: 105 meq/L (ref 96–112)
CO2: 26 mEq/L (ref 19–32)
Calcium: 8.9 mg/dL (ref 8.4–10.5)
Creatinine, Ser: 0.78 mg/dL (ref 0.40–1.20)
GFR: 80.94 mL/min (ref >60.00)
Glucose, Bld: 92 mg/dL (ref 70–99)
Potassium: 3.8 mEq/L (ref 3.5–5.1)
Sodium: 139 mEq/L (ref 135–145)

## 2014-03-09 LAB — VITAMIN D 25 HYDROXY (VIT D DEFICIENCY, FRACTURES): VITD: 65.02 ng/mL (ref 30.00–100.00)

## 2014-03-09 LAB — T4, FREE: Free T4: 1.13 ng/dL (ref 0.60–1.60)

## 2014-03-09 LAB — TSH: TSH: 1.81 u[IU]/mL (ref 0.35–4.50)

## 2014-03-11 LAB — NMR LIPOPROFILE WITH LIPIDS
Cholesterol, Total: 181 mg/dL (ref 100–199)
HDL Particle Number: 33.1 umol/L (ref >=30.5)
HDL SIZE: 9.7 nm (ref >=9.2)
HDL-C: 61 mg/dL (ref >39)
LDL CALC: 105 mg/dL — AB (ref 0–99)
LDL PARTICLE NUMBER: 1127 nmol/L — AB (ref <1000)
LDL Size: 21.6 nm (ref >=20.8)
Large HDL-P: 9.8 umol/L (ref >=4.8)
Large VLDL-P: <0.8 nmol/L (ref <=2.7)
Small LDL Particle Number: 219 nmol/L (ref <=527)
Triglycerides: 77 mg/dL (ref 0–149)
VLDL SIZE: 41 nm (ref <=46.6)

## 2014-03-13 ENCOUNTER — Encounter: Payer: Self-pay | Admitting: Internal Medicine

## 2014-03-13 DIAGNOSIS — E785 Hyperlipidemia, unspecified: Secondary | ICD-10-CM | POA: Insufficient documentation

## 2014-05-05 ENCOUNTER — Other Ambulatory Visit: Payer: Self-pay | Admitting: Internal Medicine

## 2014-05-05 ENCOUNTER — Encounter: Payer: Self-pay | Admitting: Internal Medicine

## 2014-05-05 ENCOUNTER — Ambulatory Visit (INDEPENDENT_AMBULATORY_CARE_PROVIDER_SITE_OTHER)
Admission: RE | Admit: 2014-05-05 | Discharge: 2014-05-05 | Disposition: A | Discharge status: Home / Self Care | Payer: 59 | Source: Ambulatory Visit | Attending: Internal Medicine | Admitting: Internal Medicine

## 2014-05-05 DIAGNOSIS — R0600 Dyspnea, unspecified: Secondary | ICD-10-CM

## 2014-05-09 ENCOUNTER — Encounter: Payer: Self-pay | Admitting: Internal Medicine

## 2014-05-09 ENCOUNTER — Ambulatory Visit (INDEPENDENT_AMBULATORY_CARE_PROVIDER_SITE_OTHER): Payer: 59 | Admitting: Internal Medicine

## 2014-05-09 VITALS — BP 110/80 | HR 83 | Temp 98.6°F | Resp 14 | Ht 65.5 in | Wt 171.0 lb

## 2014-05-09 DIAGNOSIS — Z8249 Family history of ischemic heart disease and other diseases of the circulatory system: Secondary | ICD-10-CM

## 2014-05-09 DIAGNOSIS — R0789 Other chest pain: Secondary | ICD-10-CM | POA: Diagnosis not present

## 2014-05-09 DIAGNOSIS — R9431 Abnormal electrocardiogram [ECG] [EKG]: Secondary | ICD-10-CM

## 2014-05-09 DIAGNOSIS — J4520 Mild intermittent asthma, uncomplicated: Secondary | ICD-10-CM | POA: Diagnosis not present

## 2014-05-09 DIAGNOSIS — K2100 Gastro-esophageal reflux disease with esophagitis, without bleeding: Secondary | ICD-10-CM

## 2014-05-09 NOTE — Progress Notes (Signed)
   Subjective:    Patient ID: Gina Graves, female    DOB: 1955/05/15, 59 y.o.   MRN: [id number]  HPI  She has been having chest tightness in the right lateral decubitus position intermittently for several months. It was becoming more frequent. Within the last week she developed a respiratory tract infection followed by GERD symptoms. This is mainly chest tightness associated with belching. The chest tightness began to occur in either lateral decubitus position after the onset of respiratory tract symptoms. She does take Prilosec as needed.  She also occasionally has had chest tightness in the morning with cold air exposure, especially climbing stairs. She has changed her Symbicort from 1 puff twice a day to 2 puffs twice a day with improvement in her symptoms. She had no chest tightness last night.  There is a strong family history of coronary artery disease in the paternal family.  Review of Systems The cold symptoms are mainly head congestion ;she did have low-grade fever last week.Frontal headache, facial pain , nasal purulence, dental pain, sore throat , otic pain or otic discharge denied. No fever , chills or sweats.  Extrinsic symptoms of itchy, watery eyes, sneezing, or angioedema are denied. There is no significant cough, sputum production, wheezing,or  paroxysmal nocturnal dyspnea.     Objective:   Physical Exam  General appearance :adequately nourished; in no distress.  Eyes: No conjunctival inflammation or scleral icterus is present.  Oral exam:  Lips and gums are healthy appearing.There is no oropharyngeal erythema or exudate noted. Dental hygiene is good.  Heart:  Normal rate and regular rhythm. S1 and S2 normal without gallop, murmur, click, rub or other extra sounds    Lungs:Breath sounds are decreased.Chest clear to auscultation; no wheezes, rhonchi,rales ,or rubs present.No increased work of breathing.   Abdomen: bowel sounds normal, soft and non-tender without  masses, organomegaly or hernias noted.  No guarding or rebound.   Vascular : all pulses equal ; no bruits present.  Skin:Warm & dry.  Intact without suspicious lesions or rashes ; no tenting or jaundice   Lymphatic: No lymphadenopathy is noted about the head, neck, axilla  Neuro: Strength, tone  normal.      Assessment & Plan:  #1 symptomatic GERD with chest tightness which is most likely esophageal spasm   #2 nonspecific ST-T changes; possible slight progression V3-6 versus 11/14/11.Strong paternal FH CAD.  #3 asthma exacerbation in the context of recent respiratory tract infection and reflux.EIB component present.   Plan: See orders and recommendations. With subtle ST-T changes and strong paternal family history Cardiology evaluation is indicated.

## 2014-05-09 NOTE — Progress Notes (Signed)
Pre visit review using our clinic review tool, if applicable. No additional management support is needed unless otherwise documented below in the visit note. 

## 2014-05-09 NOTE — Patient Instructions (Addendum)
Reflux of gastric acid may be asymptomatic as this may occur mainly during sleep.The triggers for reflux  include stress; the "aspirin family" ; alcohol; peppermint; and caffeine (coffee, tea, cola, and chocolate). The aspirin family would include aspirin and the nonsteroidal agents such as ibuprofen &  Naproxen. Tylenol would not cause reflux. If having symptoms ; food & drink should be avoided for @ least 2 hours before going to bed. Until the symptoms resolve; take the proton pump inhibitor (Prilosec) 30 minutes before breakfast and 30 minutes before the evening meal. Once the GERD symptoms have been resolved for at least 1 week; go back to once a day  30 minutes before breakfast.   Plain Mucinex (NOT D) for thick secretions ;force NON dairy fluids .   Nasal cleansing in the shower as discussed with lather of mild shampoo.After 10 seconds wash off lather while  exhaling through nostrils. Make sure that all residual soap is removed to prevent irritation.  Flonase OR Nasacort AQ 1 spray in each nostril twice a day as needed. Use the "crossover" technique into opposite nostril spraying toward opposite ear @ 45 degree angle, not straight up into nostril.  Plain Allegra (NOT D )  160 daily , Loratidine 10 mg , OR Zyrtec 10 mg @ bedtime  as needed for itchy eyes & sneezing.

## 2014-05-13 ENCOUNTER — Ambulatory Visit: Payer: 59 | Admitting: Nurse Practitioner

## 2014-06-22 ENCOUNTER — Encounter: Payer: Self-pay | Admitting: Cardiology

## 2014-06-22 ENCOUNTER — Ambulatory Visit (INDEPENDENT_AMBULATORY_CARE_PROVIDER_SITE_OTHER): Payer: 59 | Admitting: Cardiology

## 2014-06-22 VITALS — BP 108/86 | HR 70 | Ht 65.5 in | Wt 168.8 lb

## 2014-06-22 DIAGNOSIS — R011 Cardiac murmur, unspecified: Secondary | ICD-10-CM

## 2014-06-22 DIAGNOSIS — R0600 Dyspnea, unspecified: Secondary | ICD-10-CM

## 2014-06-22 DIAGNOSIS — R079 Chest pain, unspecified: Secondary | ICD-10-CM

## 2014-06-22 DIAGNOSIS — R0609 Other forms of dyspnea: Secondary | ICD-10-CM

## 2014-06-22 DIAGNOSIS — R0789 Other chest pain: Secondary | ICD-10-CM | POA: Diagnosis not present

## 2014-06-22 NOTE — Progress Notes (Signed)
Cardiology Office Note   Date:  06/22/2014   ID:  PARUL PORCELLI, DOB [birth year], MRN [id number]  PCP:  Unice Cobble, MD  Cardiologist: Darlin Coco MD  No chief complaint on file.     History of Present Illness: Gina Graves is a 59 y.o. female who presents for cardiology evaluation.  She is a medical patient of Dr. Unice Cobble.  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  She has been experiencing some intermittent chest tightness.  She has noted some chest tightness which is worse if she walks uphill or in cool weather.  She does not have any history of known heart problems.  She has been told in the past of a heart murmur.  She does have a history of asthma which bothers her mostly at night.  He does not have any history of congestive heart failure and a recent chest x-ray showed normal heart size and clear lung fields.  Indigestion to her chest tightness, the patient is also had some localized right parasternal chest discomfort consistent with costochondritis.  The patient does not get any regular exercise.  Her weight has been stable. Her family history is very strong for premature coronary disease.  Her father had his first major heart attack at age 33 and 56 years later underwent CABG.  He died at age 69 of sudden cardiac death.  Her paternal uncle and her paternal grandfather both died of heart attacks at age 9.  A maternal aunt and a maternal uncle have ICDs. The patient has had recent EKGs which have shown some fluctuating T-wave changes.  The patient also has a history of GERD.    Past Medical History  Diagnosis Date  . GERD (gastroesophageal reflux disease) 1995    hiatal hernia   . Asthma     recurrence @ age 21; ? childhood asthma as EIB    Past Surgical History  Procedure Laterality Date  . Appendectomy    . Abdominal hysterectomy  1997    uterine adenoma & dysmenorrhea  . Tonsillectomy    . Colonoscopy  2010    negative, Dr  Cristina Gong     Current Outpatient Prescriptions  Medication Sig Dispense Refill  . Acetylcysteine (N-ACETYL-L-CYSTEINE) 600 MG CAPS Take by mouth.    Gina Graves Kitchen albuterol (PROVENTIL HFA;VENTOLIN HFA) 108 (90 BASE) MCG/ACT inhaler Inhale 2 puffs into the lungs every 4 (four) hours as needed for wheezing or shortness of breath (wheezing or shortness of breath).    . ALPRAZolam (XANAX) 0.5 MG tablet Take 0.5 mg by mouth as needed (anxiety).     . AMBULATORY NON FORMULARY MEDICATION Apply 1 spray topically daily as needed (hot flashes). Medication Name: Evamist  (estrogen spray), rx'ed by GYN    . Ascorbic Acid (VITAMIN C) 1000 MG tablet Take 1,000 mg by mouth daily as needed (vitamin supplement).     . budesonide-formoterol (SYMBICORT) 160-4.5 MCG/ACT inhaler Inhale 2 puffs into the lungs 2 (two) times daily. 1 Inhaler 3  . cetirizine (ZYRTEC) 10 MG tablet Take 10 mg by mouth daily.      . Cholecalciferol (VITAMIN D3) 2000 UNITS TABS Take 2,000 Units by mouth daily as needed (vitamin supplement).     . DULoxetine (CYMBALTA) 60 MG capsule Take 120 mg by mouth daily. 2 by mouth daily      . fluticasone (FLONASE) 50 MCG/ACT nasal spray Place 1 spray into both nostrils 2 (two) times daily as needed for allergies or  rhinitis (for sinus congestion).    . folic acid (FOLVITE) 497 MCG tablet Take 400 mcg by mouth daily as needed (supplement).     . montelukast (SINGULAIR) 10 MG tablet TAKE 1 TABLET BY MOUTH ONCE DAILY 90 tablet PRN  . Multiple Vitamin (MULTIVITAMIN) tablet Take 1 tablet by mouth daily as needed (supplement).     . Nutritional Supplements (IMMUNE ENHANCE) TABS Take 1 tablet by mouth daily as needed (supplement).     Gina Graves Kitchen omeprazole (PRILOSEC) 20 MG capsule Take 20 mg by mouth as needed (for indigestion).     . Resveratrol 250 MG CAPS Take 1 capsule by mouth daily as needed (supplement).     . traZODone (DESYREL) 50 MG tablet Take 50 mg by mouth. 1/2-1 tablet at bedtime     . TURMERIC PO Take 1 tablet  by mouth daily as needed (supplement).     Karen Kays TO FIND Take 1 tablet by mouth daily as needed (supplement). Magnesium Malic acid supplement    . UNABLE TO FIND Take 1 tablet by mouth daily as needed (supplement). Joint Care Supplement    . UNABLE TO FIND Take 1 tablet by mouth daily as needed (supplement). D Ribose    . valACYclovir (VALTREX) 500 MG tablet Take 500 mg by mouth 2 (two) times daily as needed (cold sores).      No current facility-administered medications for this visit.    Allergies:   Bupropion hcl    Social History:  The patient  reports that she has never smoked. She does not have any smokeless tobacco history on file. She reports that she drinks about 1.2 oz of alcohol per week. She reports that she does not use illicit drugs.   Family History:  The patient's family history includes Asthma in her mother; Autism in her brother; Breast cancer in her mother; Coronary artery disease in an other family member; Diabetes in her paternal grandfather and paternal grandmother; Hashimoto's thyroiditis in her sister; Heart attack (age of onset: 20) in her paternal grandfather and paternal uncle; Heart attack (age of onset: 66) in her father; Heart failure in her paternal grandmother. There is no history of Stroke or COPD.    ROS:  Please see the history of present illness.   Otherwise, review of systems are positive for none.   All other systems are reviewed and negative.    PHYSICAL EXAM: VS:  BP 108/86 mmHg  Pulse 70  Ht 5' 5.5" (1.664 m)  Wt 168 lb 12.8 oz (76.567 kg)  BMI 27.65 kg/m2 , BMI Body mass index is 27.65 kg/(m^2). GEN: Well nourished, well developed, in no acute distress HEENT: normal Neck: no JVD, carotid bruits, or masses Cardiac: RRR; there is a soft systolic ejection murmur at the base.  No change with Valsalva.  No diastolic murmur.  No gallop.  There is no peripheral edema.  Pedal pulses are good. Respiratory:  clear to auscultation bilaterally, normal  work of breathing GI: soft, nontender, nondistended, + BS MS: no deformity or atrophy Skin: warm and dry, no rash Neuro:  Strength and sensation are intact Psych: euthymic mood, full affect   EKG:  EKG is ordered today. The ekg ordered today demonstrates normal sinus rhythm.  Low voltage is present.  Cannot rule out old inferior or anterior MI.  No acute ischemic changes.   Recent Labs: 03/09/2014: ALT 11; BUN 13; Creatinine 0.78; Hemoglobin 13.8; Platelets 274.0; Potassium 3.8; Sodium 139; TSH 1.81    Lipid Panel  Component Value Date/Time   CHOL 181 03/09/2014 0800   CHOL 168 11/15/2011 0806   TRIG 77 03/09/2014 0800   TRIG 56.0 11/15/2011 0806   HDL 61 03/09/2014 0800   HDL 52.00 11/15/2011 0806   CHOLHDL 3 11/15/2011 0806   VLDL 11.2 11/15/2011 0806   LDLCALC 105* 03/09/2014 0800   LDLCALC 105* 11/15/2011 0806      Wt Readings from Last 3 Encounters:  06/22/14 168 lb 12.8 oz (76.567 kg)  05/09/14 171 lb (77.565 kg)  03/03/14 172 lb 0.8 oz (78.041 kg)         ASSESSMENT AND PLAN:  1.  Atypical chest pain 2.  History of asthma 3.  Abnormal EKG 4.  Systolic heart murmur   Current medicines are reviewed at length with the patient today.  The patient does not have concerns regarding medicines.  The following changes have been made:  no change  Labs/ tests ordered today include:  Orders Placed This Encounter  Procedures  . Myocardial Perfusion Imaging  . EKG 12-Lead  . ECHOCARDIOGRAM COMPLETE     Disposition:  The patient is to continue on her same medication.  We will have her return for a treadmill Myoview stress test to evaluate her chest discomfort further.  We will also obtain a two-dimensional echocardiogram to evaluate her heart murmur. Many thanks for the opportunity to see this pleasant woman with you and I'll be in touch with you regarding the results of her tests.  No change in medication at this point.  Berna Spare  MD 06/22/2014 12:42 PM    [address], [place], [place]  [address] Phone: [phone]; Fax: [phone]

## 2014-06-22 NOTE — Patient Instructions (Signed)
Medication Instructions:  Your physician recommends that you continue on your current medications as directed. Please refer to the Current Medication list given to you today.  Labwork: NONE  Testing/Procedures: Your physician has requested that you have an echocardiogram. Echocardiography is a painless test that uses sound waves to create images of your heart. It provides your doctor with information about the size and shape of your heart and how well your heart's chambers and valves are working. This procedure takes approximately one hour. There are no restrictions for this procedure.  Your physician has requested that you have en exercise stress myoview. For further information please visit [email or web address]. Please follow instruction sheet, as given.  Follow-Up: AS NEEDED

## 2014-06-29 ENCOUNTER — Encounter: Payer: Self-pay | Admitting: Gastroenterology

## 2014-06-29 ENCOUNTER — Ambulatory Visit (HOSPITAL_COMMUNITY): Payer: 59 | Attending: Nurse Practitioner

## 2014-06-29 ENCOUNTER — Telehealth (HOSPITAL_COMMUNITY): Payer: Self-pay | Admitting: *Deleted

## 2014-06-29 ENCOUNTER — Other Ambulatory Visit: Payer: Self-pay

## 2014-06-29 DIAGNOSIS — R0609 Other forms of dyspnea: Secondary | ICD-10-CM | POA: Insufficient documentation

## 2014-06-29 DIAGNOSIS — R011 Cardiac murmur, unspecified: Secondary | ICD-10-CM | POA: Diagnosis not present

## 2014-06-29 DIAGNOSIS — R0789 Other chest pain: Secondary | ICD-10-CM | POA: Diagnosis not present

## 2014-06-29 DIAGNOSIS — R0600 Dyspnea, unspecified: Secondary | ICD-10-CM

## 2014-06-29 DIAGNOSIS — R079 Chest pain, unspecified: Secondary | ICD-10-CM | POA: Insufficient documentation

## 2014-06-29 NOTE — Telephone Encounter (Signed)
Left message on voicemail in reference to upcoming appointment scheduled on 07/04/14 at 7:45am with detailed instructions given per Myocardial Perfusion Study Information Sheet for the test. Phone number given for call back for any questions. Hubbard Robinson, RN

## 2014-07-04 ENCOUNTER — Ambulatory Visit (HOSPITAL_COMMUNITY): Payer: 59 | Attending: Cardiology

## 2014-07-04 DIAGNOSIS — R0609 Other forms of dyspnea: Secondary | ICD-10-CM | POA: Insufficient documentation

## 2014-07-04 DIAGNOSIS — R0789 Other chest pain: Secondary | ICD-10-CM

## 2014-07-04 DIAGNOSIS — R079 Chest pain, unspecified: Secondary | ICD-10-CM

## 2014-07-04 DIAGNOSIS — R011 Cardiac murmur, unspecified: Secondary | ICD-10-CM | POA: Diagnosis not present

## 2014-07-04 DIAGNOSIS — R0600 Dyspnea, unspecified: Secondary | ICD-10-CM

## 2014-07-04 LAB — MYOCARDIAL PERFUSION IMAGING
Estimated workload: 10.1 METS
Exercise duration (min): 9 min
Exercise duration (sec): 0 s
LV dias vol: 63 mL
LV sys vol: 18 mL
MPHR: 163 {beats}/min
Nuc Stress EF: 72 %
Peak HR: 144 {beats}/min
Percent HR: 88 %
RATE: 0.34
Rest HR: 76 {beats}/min
SDS: 1
SRS: 0
SSS: 1
TID: 0.88

## 2014-07-04 MED ORDER — TECHNETIUM TC 99M SESTAMIBI GENERIC - CARDIOLITE
11.0000 | Freq: Once | INTRAVENOUS | Status: AC | PRN
  Administered 2014-07-04: 11 via INTRAVENOUS

## 2014-07-04 MED ORDER — TECHNETIUM TC 99M SESTAMIBI GENERIC - CARDIOLITE
33.0000 | Freq: Once | INTRAVENOUS | Status: AC | PRN
  Administered 2014-07-04: 33 via INTRAVENOUS

## 2014-07-05 ENCOUNTER — Telehealth: Payer: Self-pay | Admitting: Cardiology

## 2014-07-05 NOTE — Telephone Encounter (Signed)
Notes Recorded by Darlin Coco, MD on 07/01/2014 at 8:55 PM Please report. The echo is satisfactory. Normal LV systolic function with grade one diastolic dysfunction. Mild MV thickening but only trivial regurgitation.  This is the echo result note from Dr. Mare Ferrari given to patient. Patient verbalized understanding and requested results to be mail. Double checked mailing address. Will send copy to patient. Patient wanting results from stress test, no results at this time.

## 2014-07-05 NOTE — Telephone Encounter (Signed)
New message      Want echo/stress test results

## 2014-07-08 ENCOUNTER — Other Ambulatory Visit: Payer: Self-pay | Admitting: Emergency Medicine

## 2014-07-08 ENCOUNTER — Telehealth: Payer: Self-pay | Admitting: Internal Medicine

## 2014-07-08 DIAGNOSIS — J4541 Moderate persistent asthma with (acute) exacerbation: Secondary | ICD-10-CM

## 2014-07-08 MED ORDER — BUDESONIDE-FORMOTEROL FUMARATE 160-4.5 MCG/ACT IN AERO: 2.0000 | INHALATION_SPRAY | Freq: Two times a day (BID) | RESPIRATORY_TRACT | Status: DC

## 2014-07-08 NOTE — Telephone Encounter (Signed)
Patient needs refill for budesonide-formoterol [facility]) 160-4.5 MCG/ACT inhaler [[id number]] asap. She is leaving town as soon as she gets it. Pharmacy is [facility]

## 2014-07-14 ENCOUNTER — Telehealth: Payer: Self-pay | Admitting: Cardiology

## 2014-07-14 ENCOUNTER — Encounter: Payer: Self-pay | Admitting: Cardiology

## 2014-07-14 NOTE — Telephone Encounter (Signed)
Patient notified of Myocardial Perfusion imaging study results - "Normal Myocardial perfusion stress test with no ischemia and normal LVF. EF 72%. Patient exercised for 9 minutes achieving 10.1 mets with no ST changes from baseline EKG.", per Dr. Fransico Him and "her stress test is ok", per Dr. Darlin Coco. Patient verbalized understanding and appreciation for phone call. Denies further questions or concerns.

## 2014-07-14 NOTE — Telephone Encounter (Signed)
New message     Returing the call of triage nurse regarding stress test results.  Please advise

## 2014-07-14 NOTE — Telephone Encounter (Signed)
LMTCB

## 2014-08-08 ENCOUNTER — Telehealth: Payer: Self-pay | Admitting: Geriatric Medicine

## 2014-08-08 NOTE — Telephone Encounter (Signed)
Patient said she had a mammogram in April at [facility].

## 2014-08-28 ENCOUNTER — Encounter: Payer: Self-pay | Admitting: Internal Medicine

## 2014-08-30 ENCOUNTER — Ambulatory Visit (INDEPENDENT_AMBULATORY_CARE_PROVIDER_SITE_OTHER): Payer: 59 | Admitting: Internal Medicine

## 2014-08-30 ENCOUNTER — Encounter: Payer: Self-pay | Admitting: Internal Medicine

## 2014-08-30 VITALS — BP 130/86 | HR 74 | Temp 97.7°F | Resp 20 | Wt 171.0 lb

## 2014-08-30 DIAGNOSIS — J209 Acute bronchitis, unspecified: Secondary | ICD-10-CM | POA: Diagnosis not present

## 2014-08-30 MED ORDER — HYDROCODONE-HOMATROPINE 5-1.5 MG/5ML PO SYRP: 5.0000 mL | ORAL_SOLUTION | Freq: Four times a day (QID) | ORAL | Status: DC | PRN

## 2014-08-30 MED ORDER — AMOXICILLIN-POT CLAVULANATE 875-125 MG PO TABS: 1.0000 | ORAL_TABLET | Freq: Two times a day (BID) | ORAL | Status: DC

## 2014-08-30 MED ORDER — PREDNISONE 10 MG PO TABS: ORAL_TABLET | ORAL | Status: DC

## 2014-08-30 NOTE — Patient Instructions (Signed)

## 2014-08-30 NOTE — Progress Notes (Signed)
Pre visit review using our clinic review tool, if applicable. No additional management support is needed unless otherwise documented below in the visit note. 

## 2014-08-30 NOTE — Progress Notes (Signed)
   Subjective:    Patient ID: Gina Graves, female    DOB: 08/30/1955, 59 y.o.   MRN: [id number]  HPI Her symptoms began 3 weeks ago as head congestion, sweats, low-grade fever, chills, and a cough. Initially she was swallowing any secretions. As of 8/8 she began to expectorate yellow sputum.  She's had minor sneezing but no other extrinsic symptoms. The cough is not associated with shortness of breath or wheezing.   Review of Systems Frontal headache, facial pain , nasal purulence, dental pain, sore throat , otic pain or otic discharge denied.    Objective:   Physical Exam  General appearance:Adequately nourished; no acute distress or increased work of breathing is present.    Lymphatic: No  lymphadenopathy about the head, neck, or axilla .  Eyes: No conjunctival inflammation or lid edema is present. There is no scleral icterus.  Ears:  External ear exam shows no significant lesions or deformities.  Otoscopic examination reveals clear canals, tympanic membranes are intact bilaterally without bulging, retraction, inflammation or discharge.  Nose:  External nasal examination shows no deformity or inflammation. Nasal mucosa are erythematous slightly & dry without lesions or exudates No septal dislocation or deviation.No obstruction to airflow.   Oral exam: Dental hygiene is good; lips and gums are healthy appearing.There is no oropharyngeal erythema or exudate .  Neck:  No deformities, thyromegaly, masses, or tenderness noted.   Supple with full range of motion without pain.   Heart:  Normal rate and regular rhythm. S1 and S2 normal without gallop, murmur, click, rub or other extra sounds.   Lungs:Chest clear to auscultation; no wheezes, rhonchi,rales ,or rubs present.  Extremities:  No cyanosis, edema, or clubbing  noted    Skin: Warm & diaphoretic w/o tenting or jaundice. No significant lesions or rash.        Assessment & Plan:  #1 acute bronchitis w/o  bronchospasm  Plan: See orders and recommendations

## 2014-12-12 ENCOUNTER — Encounter: Payer: Self-pay | Admitting: Internal Medicine

## 2014-12-13 ENCOUNTER — Telehealth: Payer: 59 | Admitting: Family

## 2014-12-13 DIAGNOSIS — R059 Cough, unspecified: Secondary | ICD-10-CM

## 2014-12-13 DIAGNOSIS — R05 Cough: Secondary | ICD-10-CM

## 2014-12-13 MED ORDER — BENZONATATE 100 MG PO CAPS: 100.0000 mg | ORAL_CAPSULE | Freq: Two times a day (BID) | ORAL | Status: DC | PRN

## 2014-12-13 MED ORDER — AZITHROMYCIN 250 MG PO TABS: ORAL_TABLET | ORAL | Status: DC

## 2014-12-13 NOTE — Progress Notes (Signed)

## 2014-12-14 MED ORDER — HYDROCODONE-HOMATROPINE 5-1.5 MG/5ML PO SYRP: ORAL_SOLUTION | ORAL | Status: DC

## 2014-12-14 NOTE — Telephone Encounter (Signed)
Pt is had been notified to come get RX at front office.

## 2015-01-23 MED FILL — ALPRAZolam 0.5 MG TABS: 0.5 | 90 days supply | Qty: 180 | Fill #0

## 2015-01-23 MED FILL — SYMBICORT 160-4.5 MCG INH: 160-4.5 | 30 days supply | Qty: 10 | Fill #2

## 2015-02-16 DIAGNOSIS — J329 Chronic sinusitis, unspecified: Secondary | ICD-10-CM | POA: Diagnosis not present

## 2015-02-16 DIAGNOSIS — M9903 Segmental and somatic dysfunction of lumbar region: Secondary | ICD-10-CM | POA: Diagnosis not present

## 2015-02-16 DIAGNOSIS — M9901 Segmental and somatic dysfunction of cervical region: Secondary | ICD-10-CM | POA: Diagnosis not present

## 2015-02-16 DIAGNOSIS — R51 Headache: Secondary | ICD-10-CM | POA: Diagnosis not present

## 2015-02-20 DIAGNOSIS — J329 Chronic sinusitis, unspecified: Secondary | ICD-10-CM | POA: Diagnosis not present

## 2015-02-20 DIAGNOSIS — R51 Headache: Secondary | ICD-10-CM | POA: Diagnosis not present

## 2015-02-20 DIAGNOSIS — M9903 Segmental and somatic dysfunction of lumbar region: Secondary | ICD-10-CM | POA: Diagnosis not present

## 2015-02-20 DIAGNOSIS — M9901 Segmental and somatic dysfunction of cervical region: Secondary | ICD-10-CM | POA: Diagnosis not present

## 2015-02-24 DIAGNOSIS — R51 Headache: Secondary | ICD-10-CM | POA: Diagnosis not present

## 2015-02-24 DIAGNOSIS — J329 Chronic sinusitis, unspecified: Secondary | ICD-10-CM | POA: Diagnosis not present

## 2015-02-24 DIAGNOSIS — M9901 Segmental and somatic dysfunction of cervical region: Secondary | ICD-10-CM | POA: Diagnosis not present

## 2015-02-24 DIAGNOSIS — M9903 Segmental and somatic dysfunction of lumbar region: Secondary | ICD-10-CM | POA: Diagnosis not present

## 2015-02-28 MED FILL — MONTELUKAST SOD 10 MG TAB: 10 | 90 days supply | Qty: 90 | Fill #4

## 2015-03-03 MED FILL — DULoxetine HCL 60 MG CPEP: 60 | 90 days supply | Qty: 180 | Fill #0

## 2015-03-14 DIAGNOSIS — F341 Dysthymic disorder: Secondary | ICD-10-CM | POA: Diagnosis not present

## 2015-03-14 DIAGNOSIS — F3341 Major depressive disorder, recurrent, in partial remission: Secondary | ICD-10-CM | POA: Diagnosis not present

## 2015-03-14 MED FILL — traZODone HCL 50 MG TABS: 50 | 90 days supply | Qty: 180 | Fill #0

## 2015-03-23 DIAGNOSIS — J329 Chronic sinusitis, unspecified: Secondary | ICD-10-CM | POA: Diagnosis not present

## 2015-03-23 DIAGNOSIS — R51 Headache: Secondary | ICD-10-CM | POA: Diagnosis not present

## 2015-03-23 DIAGNOSIS — M9903 Segmental and somatic dysfunction of lumbar region: Secondary | ICD-10-CM | POA: Diagnosis not present

## 2015-03-23 DIAGNOSIS — M9901 Segmental and somatic dysfunction of cervical region: Secondary | ICD-10-CM | POA: Diagnosis not present

## 2015-04-20 DIAGNOSIS — M9901 Segmental and somatic dysfunction of cervical region: Secondary | ICD-10-CM | POA: Diagnosis not present

## 2015-04-20 DIAGNOSIS — J329 Chronic sinusitis, unspecified: Secondary | ICD-10-CM | POA: Diagnosis not present

## 2015-04-20 DIAGNOSIS — R51 Headache: Secondary | ICD-10-CM | POA: Diagnosis not present

## 2015-04-20 DIAGNOSIS — M9903 Segmental and somatic dysfunction of lumbar region: Secondary | ICD-10-CM | POA: Diagnosis not present

## 2015-04-21 ENCOUNTER — Telehealth: Payer: 59 | Admitting: Family

## 2015-04-21 DIAGNOSIS — J029 Acute pharyngitis, unspecified: Secondary | ICD-10-CM | POA: Diagnosis not present

## 2015-04-21 DIAGNOSIS — B9789 Other viral agents as the cause of diseases classified elsewhere: Principal | ICD-10-CM

## 2015-04-21 DIAGNOSIS — J028 Acute pharyngitis due to other specified organisms: Principal | ICD-10-CM

## 2015-04-21 NOTE — Progress Notes (Signed)

## 2015-04-24 DIAGNOSIS — J209 Acute bronchitis, unspecified: Secondary | ICD-10-CM | POA: Diagnosis not present

## 2015-04-24 DIAGNOSIS — J45998 Other asthma: Secondary | ICD-10-CM | POA: Diagnosis not present

## 2015-04-24 DIAGNOSIS — J09X2 Influenza due to identified novel influenza A virus with other respiratory manifestations: Secondary | ICD-10-CM | POA: Diagnosis not present

## 2015-04-24 MED FILL — AMOX TR-K CLV 875-125 MG TA: 875-125 | 7 days supply | Qty: 14 | Fill #0

## 2015-05-04 MED FILL — ALPRAZolam 0.5 MG TABS: 0.5 | 90 days supply | Qty: 180 | Fill #0

## 2015-05-23 DIAGNOSIS — M9903 Segmental and somatic dysfunction of lumbar region: Secondary | ICD-10-CM | POA: Diagnosis not present

## 2015-05-23 DIAGNOSIS — M9901 Segmental and somatic dysfunction of cervical region: Secondary | ICD-10-CM | POA: Diagnosis not present

## 2015-05-23 DIAGNOSIS — R51 Headache: Secondary | ICD-10-CM | POA: Diagnosis not present

## 2015-05-23 DIAGNOSIS — J329 Chronic sinusitis, unspecified: Secondary | ICD-10-CM | POA: Diagnosis not present

## 2015-05-30 DIAGNOSIS — Z01419 Encounter for gynecological examination (general) (routine) without abnormal findings: Secondary | ICD-10-CM | POA: Diagnosis not present

## 2015-05-30 DIAGNOSIS — Z1231 Encounter for screening mammogram for malignant neoplasm of breast: Secondary | ICD-10-CM | POA: Diagnosis not present

## 2015-05-30 DIAGNOSIS — Z6827 Body mass index (BMI) 27.0-27.9, adult: Secondary | ICD-10-CM | POA: Diagnosis not present

## 2015-05-30 MED FILL — EVAMIST 1.53 MG/SPRAY: 1.53 | 90 days supply | Qty: 24 | Fill #0

## 2015-05-30 MED FILL — VALACYCLOVIR HCL 500 MG TAB: 500 | 30 days supply | Qty: 30 | Fill #0

## 2015-05-31 ENCOUNTER — Other Ambulatory Visit: Payer: Self-pay | Admitting: Internal Medicine

## 2015-05-31 MED FILL — DULoxetine HCL 60 MG CPEP: 60 | 90 days supply | Qty: 180 | Fill #0

## 2015-05-31 MED FILL — MONTELUKAST SOD 10 MG TAB: 10 | 90 days supply | Qty: 90 | Fill #0

## 2015-06-07 DIAGNOSIS — Z13820 Encounter for screening for osteoporosis: Secondary | ICD-10-CM | POA: Diagnosis not present

## 2015-06-09 ENCOUNTER — Encounter: Payer: Self-pay | Admitting: Internal Medicine

## 2015-06-09 ENCOUNTER — Ambulatory Visit (INDEPENDENT_AMBULATORY_CARE_PROVIDER_SITE_OTHER): Payer: 59 | Admitting: Internal Medicine

## 2015-06-09 VITALS — BP 140/90 | HR 73 | Temp 98.1°F | Resp 12 | Ht 65.5 in | Wt 168.0 lb

## 2015-06-09 DIAGNOSIS — Z0000 Encounter for general adult medical examination without abnormal findings: Secondary | ICD-10-CM | POA: Diagnosis not present

## 2015-06-09 DIAGNOSIS — E785 Hyperlipidemia, unspecified: Secondary | ICD-10-CM

## 2015-06-09 DIAGNOSIS — J4520 Mild intermittent asthma, uncomplicated: Secondary | ICD-10-CM | POA: Diagnosis not present

## 2015-06-09 MED ORDER — MONTELUKAST SODIUM 10 MG PO TABS: 10.0000 mg | ORAL_TABLET | Freq: Every day | ORAL | Status: DC

## 2015-06-09 NOTE — Assessment & Plan Note (Signed)
Checking lipid panel but last at goal without meds.

## 2015-06-09 NOTE — Assessment & Plan Note (Signed)
Checking labs, non-smoker. Not exercising and counseled her about the need to start regular exercise. Colonoscopy and mammogram up to date and dexa up to date.

## 2015-06-09 NOTE — Assessment & Plan Note (Signed)
Using symbicort only with colds or severe symptoms. Mild intermittent and rare albuterol. Takes singulair which was refilled today.

## 2015-06-09 NOTE — Progress Notes (Signed)
   Subjective:    Patient ID: Gina Graves, female    DOB: [birth year], 60 y.o.   MRN: [id number]  HPI The patient is a 60 YO female coming in for wellness. No new concerns today. Using symbicort rarely and usually with concurrent bronchitis. Uses albuterol rarely as well. Not exercising, not smoker.   PMH, [facility], social history reviewed and updated.   Review of Systems  Constitutional: Negative for fever, activity change, appetite change, fatigue and unexpected weight change.  HENT: Negative.   Eyes: Negative.   Respiratory: Negative for cough, chest tightness, shortness of breath and wheezing.   Cardiovascular: Negative for chest pain, palpitations and leg swelling.  Gastrointestinal: Negative for nausea, abdominal pain, diarrhea, constipation and abdominal distention.  Musculoskeletal: Negative.   Skin: Negative.   Neurological: Negative.   Psychiatric/Behavioral: Negative.       Objective:   Physical Exam  Constitutional: She is oriented to person, place, and time. She appears well-developed and well-nourished.  HENT:  Head: Normocephalic and atraumatic.  Eyes: EOM are normal.  Neck: Normal range of motion. No thyromegaly present.  Cardiovascular: Normal rate and regular rhythm.   Pulmonary/Chest: Effort normal and breath sounds normal. No respiratory distress. She has no wheezes. She has no rales.  Abdominal: Soft. Bowel sounds are normal. She exhibits no distension. There is no tenderness. There is no rebound.  Musculoskeletal: She exhibits no edema.  Neurological: She is alert and oriented to person, place, and time. Coordination normal.  Skin: Skin is warm and dry.  Psychiatric: She has a normal mood and affect.   Filed Vitals:   06/09/15 1102  BP: 140/90  Pulse: 73  Temp: 98.1 F (36.7 C)  TempSrc: Oral  Resp: 12  Height: 5' 5.5" (1.664 m)  Weight: 168 lb (76.204 kg)  SpO2: 98%      Assessment & Plan:

## 2015-06-09 NOTE — Progress Notes (Signed)
Pre visit review using our clinic review tool, if applicable. No additional management support is needed unless otherwise documented below in the visit note. 

## 2015-06-09 NOTE — Patient Instructions (Signed)
We will have you come back fasting to the lab for the blood work and we will send the results to mychart.   Call us with any problems or questions and come back next year for the physical.   Work on starting to exercise 5 times per week to help with long term health and especially heart health. Exercise is also good for the mood and can naturally help lift the mood.   Health Maintenance, Female Adopting a healthy lifestyle and getting preventive care can go a long way to promote health and wellness. Talk with your health care provider about what schedule of regular examinations is right for you. This is a good chance for you to check in with your provider about disease prevention and staying healthy. In between checkups, there are plenty of things you can do on your own. Experts have done a lot of research about which lifestyle changes and preventive measures are most likely to keep you healthy. Ask your health care provider for more information. WEIGHT AND DIET  Eat a healthy diet  Be sure to include plenty of vegetables, fruits, low-fat dairy products, and lean protein.  Do not eat a lot of foods high in solid fats, added sugars, or salt.  Get regular exercise. This is one of the most important things you can do for your health.  Most adults should exercise for at least 150 minutes each week. The exercise should increase your heart rate and make you sweat (moderate-intensity exercise).  Most adults should also do strengthening exercises at least twice a week. This is in addition to the moderate-intensity exercise.  Maintain a healthy weight  Body mass index (BMI) is a measurement that can be used to identify possible weight problems. It estimates body fat based on height and weight. Your health care provider can help determine your BMI and help you achieve or maintain a healthy weight.  For females 47 years of age and older:   A BMI below 18.5 is considered underweight.  A BMI of  18.5 to 24.9 is normal.  A BMI of 25 to 29.9 is considered overweight.  A BMI of 30 and above is considered obese.  Watch levels of cholesterol and blood lipids  You should start having your blood tested for lipids and cholesterol at 58 years of age, then have this test every 5 years.  You may need to have your cholesterol levels checked more often if:  Your lipid or cholesterol levels are high.  You are older than 58 years of age.  You are at high risk for heart disease.  CANCER SCREENING   Lung Cancer  Lung cancer screening is recommended for adults 31-21 years old who are at high risk for lung cancer because of a history of smoking.  A yearly low-dose CT scan of the lungs is recommended for people who:  Currently smoke.  Have quit within the past 15 years.  Have at least a 30-pack-year history of smoking. A pack year is smoking an average of one pack of cigarettes a day for 1 year.  Yearly screening should continue until it has been 15 years since you quit.  Yearly screening should stop if you develop a health problem that would prevent you from having lung cancer treatment.  Breast Cancer  Practice breast self-awareness. This means understanding how your breasts normally appear and feel.  It also means doing regular breast self-exams. Let your health care provider know about any changes, no matter how  small.  If you are in your 20s or 30s, you should have a clinical breast exam (CBE) by a health care provider every 1-3 years as part of a regular health exam.  If you are 40 or older, have a CBE every year. Also consider having a breast X-ray (mammogram) every year.  If you have a family history of breast cancer, talk to your health care provider about genetic screening.  If you are at high risk for breast cancer, talk to your health care provider about having an MRI and a mammogram every year.  Breast cancer gene (BRCA) assessment is recommended for women who  have family members with BRCA-related cancers. BRCA-related cancers include:  Breast.  Ovarian.  Tubal.  Peritoneal cancers.  Results of the assessment will determine the need for genetic counseling and BRCA1 and BRCA2 testing. Cervical Cancer Your health care provider may recommend that you be screened regularly for cancer of the pelvic organs (ovaries, uterus, and vagina). This screening involves a pelvic examination, including checking for microscopic changes to the surface of your cervix (Pap test). You may be encouraged to have this screening done every 3 years, beginning at age 54.  For women ages 36-65, health care providers may recommend pelvic exams and Pap testing every 3 years, or they may recommend the Pap and pelvic exam, combined with testing for human papilloma virus (HPV), every 5 years. Some types of HPV increase your risk of cervical cancer. Testing for HPV may also be done on women of any age with unclear Pap test results.  Other health care providers may not recommend any screening for nonpregnant women who are considered low risk for pelvic cancer and who do not have symptoms. Ask your health care provider if a screening pelvic exam is right for you.  If you have had past treatment for cervical cancer or a condition that could lead to cancer, you need Pap tests and screening for cancer for at least 20 years after your treatment. If Pap tests have been discontinued, your risk factors (such as having a new sexual partner) need to be reassessed to determine if screening should resume. Some women have medical problems that increase the chance of getting cervical cancer. In these cases, your health care provider may recommend more frequent screening and Pap tests. Colorectal Cancer  This type of cancer can be detected and often prevented.  Routine colorectal cancer screening usually begins at 58 years of age and continues through 58 years of age.  Your health care provider  may recommend screening at an earlier age if you have risk factors for colon cancer.  Your health care provider may also recommend using home test kits to check for hidden blood in the stool.  A small camera at the end of a tube can be used to examine your colon directly (sigmoidoscopy or colonoscopy). This is done to check for the earliest forms of colorectal cancer.  Routine screening usually begins at age 79.  Direct examination of the colon should be repeated every 5-10 years through 58 years of age. However, you may need to be screened more often if early forms of precancerous polyps or small growths are found. Skin Cancer  Check your skin from head to toe regularly.  Tell your health care provider about any new moles or changes in moles, especially if there is a change in a mole's shape or color.  Also tell your health care provider if you have a mole that is larger  than the size of a pencil eraser.  Always use sunscreen. Apply sunscreen liberally and repeatedly throughout the day.  Protect yourself by wearing long sleeves, pants, a wide-brimmed hat, and sunglasses whenever you are outside. HEART DISEASE, DIABETES, AND HIGH BLOOD PRESSURE   High blood pressure causes heart disease and increases the risk of stroke. High blood pressure is more likely to develop in:  People who have blood pressure in the high end of the normal range (130-139/85-89 mm Hg).  People who are overweight or obese.  People who are African American.  If you are 36-82 years of age, have your blood pressure checked every 3-5 years. If you are 18 years of age or older, have your blood pressure checked every year. You should have your blood pressure measured twice--once when you are at a hospital or clinic, and once when you are not at a hospital or clinic. Record the average of the two measurements. To check your blood pressure when you are not at a hospital or clinic, you can use:  An automated blood  pressure machine at a pharmacy.  A home blood pressure monitor.  If you are between 35 years and 83 years old, ask your health care provider if you should take aspirin to prevent strokes.  Have regular diabetes screenings. This involves taking a blood sample to check your fasting blood sugar level.  If you are at a normal weight and have a low risk for diabetes, have this test once every three years after 58 years of age.  If you are overweight and have a high risk for diabetes, consider being tested at a younger age or more often. PREVENTING INFECTION  Hepatitis B  If you have a higher risk for hepatitis B, you should be screened for this virus. You are considered at high risk for hepatitis B if:  You were born in a country where hepatitis B is common. Ask your health care provider which countries are considered high risk.  Your parents were born in a high-risk country, and you have not been immunized against hepatitis B (hepatitis B vaccine).  You have HIV or AIDS.  You use needles to inject street drugs.  You live with someone who has hepatitis B.  You have had sex with someone who has hepatitis B.  You get hemodialysis treatment.  You take certain medicines for conditions, including cancer, organ transplantation, and autoimmune conditions. Hepatitis C  Blood testing is recommended for:  Everyone born from [birth year] through 1963.  Anyone with known risk factors for hepatitis C. Sexually transmitted infections (STIs)  You should be screened for sexually transmitted infections (STIs) including gonorrhea and chlamydia if:  You are sexually active and are younger than 58 years of age.  You are older than 59 years of age and your health care provider tells you that you are at risk for this type of infection.  Your sexual activity has changed since you were last screened and you are at an increased risk for chlamydia or gonorrhea. Ask your health care provider if you are at  risk.  If you do not have HIV, but are at risk, it may be recommended that you take a prescription medicine daily to prevent HIV infection. This is called pre-exposure prophylaxis (PrEP). You are considered at risk if:  You are sexually active and do not regularly use condoms or know the HIV status of your partner(s).  You take drugs by injection.  You are sexually active with a  partner who has HIV. Talk with your health care provider about whether you are at high risk of being infected with HIV. If you choose to begin PrEP, you should first be tested for HIV. You should then be tested every 3 months for as long as you are taking PrEP.  PREGNANCY   If you are premenopausal and you may become pregnant, ask your health care provider about preconception counseling.  If you may become pregnant, take 400 to 800 micrograms (mcg) of folic acid every day.  If you want to prevent pregnancy, talk to your health care provider about birth control (contraception). OSTEOPOROSIS AND MENOPAUSE   Osteoporosis is a disease in which the bones lose minerals and strength with aging. This can result in serious bone fractures. Your risk for osteoporosis can be identified using a bone density scan.  If you are 36 years of age or older, or if you are at risk for osteoporosis and fractures, ask your health care provider if you should be screened.  Ask your health care provider whether you should take a calcium or vitamin D supplement to lower your risk for osteoporosis.  Menopause may have certain physical symptoms and risks.  Hormone replacement therapy may reduce some of these symptoms and risks. Talk to your health care provider about whether hormone replacement therapy is right for you.  HOME CARE INSTRUCTIONS   Schedule regular health, dental, and eye exams.  Stay current with your immunizations.   Do not use any tobacco products including cigarettes, chewing tobacco, or electronic cigarettes.  If  you are pregnant, do not drink alcohol.  If you are breastfeeding, limit how much and how often you drink alcohol.  Limit alcohol intake to no more than 1 drink per day for nonpregnant women. One drink equals 12 ounces of beer, 5 ounces of wine, or 1 ounces of hard liquor.  Do not use street drugs.  Do not share needles.  Ask your health care provider for help if you need support or information about quitting drugs.  Tell your health care provider if you often feel depressed.  Tell your health care provider if you have ever been abused or do not feel safe at home.   This information is not intended to replace advice given to you by your health care provider. Make sure you discuss any questions you have with your health care provider.   Document Released: 07/23/2010 Document Revised: 01/28/2014 Document Reviewed: 12/09/2012 Elsevier Interactive Patient Education [employer].

## 2015-06-15 ENCOUNTER — Other Ambulatory Visit (INDEPENDENT_AMBULATORY_CARE_PROVIDER_SITE_OTHER): Payer: 59

## 2015-06-15 DIAGNOSIS — Z0000 Encounter for general adult medical examination without abnormal findings: Secondary | ICD-10-CM

## 2015-06-15 LAB — COMPREHENSIVE METABOLIC PANEL
ALT: 9 U/L (ref 0–35)
AST: 11 U/L (ref 0–37)
Albumin: 4.2 g/dL (ref 3.5–5.2)
Alkaline Phosphatase: 37 U/L — ABNORMAL LOW (ref 39–117)
BILIRUBIN TOTAL: 0.5 mg/dL (ref 0.2–1.2)
BUN: 16 mg/dL (ref 6–23)
CO2: 29 meq/L (ref 19–32)
CREATININE: 0.75 mg/dL (ref 0.40–1.20)
Calcium: 9.2 mg/dL (ref 8.4–10.5)
Chloride: 108 mEq/L (ref 96–112)
GFR: 84.31 mL/min (ref >60.00)
GLUCOSE: 95 mg/dL (ref 70–99)
Potassium: 4.1 mEq/L (ref 3.5–5.1)
Sodium: 143 mEq/L (ref 135–145)
Total Protein: 6.3 g/dL (ref 6.0–8.3)

## 2015-06-15 LAB — CBC
HCT: 41.2 % (ref 36.0–46.0)
Hemoglobin: 13.7 g/dL (ref 12.0–15.0)
MCHC: 33.3 g/dL (ref 30.0–36.0)
MCV: 93 fl (ref 78.0–100.0)
Platelets: 303 10*3/uL (ref 150.0–400.0)
RBC: 4.43 Mil/uL (ref 3.87–5.11)
RDW: 14.1 % (ref 11.5–15.5)
WBC: 4.8 10*3/uL (ref 4.0–10.5)

## 2015-06-15 LAB — LIPID PANEL
CHOL/HDL RATIO: 4
Cholesterol: 173 mg/dL (ref 0–200)
HDL: 49.4 mg/dL (ref >39.00)
LDL Cholesterol: 106 mg/dL — ABNORMAL HIGH (ref 0–99)
NONHDL: 123.97
TRIGLYCERIDES: 91 mg/dL (ref 0.0–149.0)
VLDL: 18.2 mg/dL (ref 0.0–40.0)

## 2015-06-15 LAB — TSH: TSH: 1.8 u[IU]/mL (ref 0.35–4.50)

## 2015-06-16 ENCOUNTER — Encounter: Payer: Self-pay | Admitting: Internal Medicine

## 2015-06-22 ENCOUNTER — Other Ambulatory Visit: Payer: Self-pay | Admitting: Internal Medicine

## 2015-06-22 DIAGNOSIS — R51 Headache: Secondary | ICD-10-CM | POA: Diagnosis not present

## 2015-06-22 DIAGNOSIS — M9901 Segmental and somatic dysfunction of cervical region: Secondary | ICD-10-CM | POA: Diagnosis not present

## 2015-06-22 DIAGNOSIS — J329 Chronic sinusitis, unspecified: Secondary | ICD-10-CM | POA: Diagnosis not present

## 2015-06-22 DIAGNOSIS — M9903 Segmental and somatic dysfunction of lumbar region: Secondary | ICD-10-CM | POA: Diagnosis not present

## 2015-06-22 MED ORDER — OMEPRAZOLE 20 MG PO CPDR: 20.0000 mg | DELAYED_RELEASE_CAPSULE | ORAL | Status: DC | PRN

## 2015-06-22 MED FILL — OMEPRAZOLE DR 20 MG CAPSULE: 20 | 90 days supply | Qty: 90 | Fill #0

## 2015-07-26 DIAGNOSIS — M9903 Segmental and somatic dysfunction of lumbar region: Secondary | ICD-10-CM | POA: Diagnosis not present

## 2015-07-26 DIAGNOSIS — R51 Headache: Secondary | ICD-10-CM | POA: Diagnosis not present

## 2015-07-26 DIAGNOSIS — J329 Chronic sinusitis, unspecified: Secondary | ICD-10-CM | POA: Diagnosis not present

## 2015-07-26 DIAGNOSIS — M9901 Segmental and somatic dysfunction of cervical region: Secondary | ICD-10-CM | POA: Diagnosis not present

## 2015-08-28 MED FILL — ALPRAZolam 0.5 MG TABS: 0.5 | 90 days supply | Qty: 180 | Fill #1

## 2015-08-29 MED FILL — DULoxetine HCL 60 MG CPEP: 60 | 90 days supply | Qty: 180 | Fill #1

## 2015-09-04 MED FILL — MONTELUKAST SOD 10 MG TAB: 10 | 90 days supply | Qty: 90 | Fill #0

## 2015-09-07 ENCOUNTER — Encounter: Payer: Self-pay | Admitting: Internal Medicine

## 2015-09-07 MED ORDER — OMEPRAZOLE 20 MG PO CPDR: 20.0000 mg | DELAYED_RELEASE_CAPSULE | Freq: Two times a day (BID) | ORAL | 0 refills | Status: DC

## 2015-09-07 MED FILL — OMEPRAZOLE DR 20 MG CAPSULE: 20 | 90 days supply | Qty: 180 | Fill #0

## 2015-09-12 DIAGNOSIS — F341 Dysthymic disorder: Secondary | ICD-10-CM | POA: Diagnosis not present

## 2015-09-12 DIAGNOSIS — F3341 Major depressive disorder, recurrent, in partial remission: Secondary | ICD-10-CM | POA: Diagnosis not present

## 2015-10-02 MED FILL — traZODone HCL 50 MG TABS: 50 | 90 days supply | Qty: 180 | Fill #1

## 2015-11-02 ENCOUNTER — Ambulatory Visit (INDEPENDENT_AMBULATORY_CARE_PROVIDER_SITE_OTHER): Payer: 59 | Admitting: Internal Medicine

## 2015-11-02 ENCOUNTER — Encounter: Payer: Self-pay | Admitting: Internal Medicine

## 2015-11-02 DIAGNOSIS — H60392 Other infective otitis externa, left ear: Secondary | ICD-10-CM

## 2015-11-02 DIAGNOSIS — H60399 Other infective otitis externa, unspecified ear: Secondary | ICD-10-CM | POA: Insufficient documentation

## 2015-11-02 MED ORDER — NEOMYCIN-POLYMYXIN-HC 3.5-10000-1 OT SOLN: 3.0000 [drp] | Freq: Three times a day (TID) | OTIC | 0 refills | Status: DC

## 2015-11-02 MED FILL — NEO/POLYMYXIN/HC EAR SOLN: 3.5-10000-1 | 23 days supply | Qty: 10 | Fill #0

## 2015-11-02 NOTE — Progress Notes (Signed)
Pre visit review using our clinic review tool, if applicable. No additional management support is needed unless otherwise documented below in the visit note. 

## 2015-11-02 NOTE — Progress Notes (Signed)
   Subjective:    Patient ID: Gina Graves, female    DOB: October 13, 1955, 60 y.o.   MRN: [id number]  HPI The patient is a 60 YO female coming in for left ear pain for about 6 weeks. Initially there was a rash on the ear pinnae which disappeared within 1 week. Now she is having some pain in the ear canal and no discharge from the ear. No fevers or chills. No hearing change or tinnitus.   Review of Systems  Constitutional: Negative for activity change, appetite change, fatigue, fever and unexpected weight change.  HENT: Positive for ear pain. Negative for congestion, dental problem, ear discharge, hearing loss, postnasal drip, rhinorrhea, sinus pressure, sore throat, tinnitus and trouble swallowing.   Eyes: Negative.   Respiratory: Negative.   Cardiovascular: Negative.   Gastrointestinal: Negative.   Neurological: Negative.       Objective:   Physical Exam  Constitutional: She is oriented to person, place, and time. She appears well-developed and well-nourished.  HENT:  Head: Normocephalic and atraumatic.  Nose: Nose normal.  Mouth/Throat: Oropharynx is clear and moist.  Bilateral TM normal, some redness in the left canal and pain with tugging on the ear  Eyes: EOM are normal.  Neck: Normal range of motion.  Cardiovascular: Normal rate and regular rhythm.   Pulmonary/Chest: Effort normal and breath sounds normal.  Abdominal: Soft.  Musculoskeletal: She exhibits no edema.  Neurological: She is alert and oriented to person, place, and time.  Skin: Skin is warm and dry.   Vitals:   11/02/15 1433  BP: 128/78  Pulse: 77  Resp: 14  Temp: 98.2 F (36.8 C)  TempSrc: Oral  SpO2: 97%  Weight: 174 lb (78.9 kg)  Height: 5' 5.5" (1.664 m)      Assessment & Plan:

## 2015-11-02 NOTE — Patient Instructions (Signed)
We have sent in the ear drops corticosporin. Take 3 drops in the left ear, 3 times per day for symptoms to clear (usually 3-5 days).   You can keep the rest and use again if symptoms return.

## 2015-11-02 NOTE — Assessment & Plan Note (Signed)
Rx for corticosporin drops for use. She will continue her allergy medication as well.

## 2015-11-27 MED FILL — DULoxetine HCL 60 MG CPEP: 60 | 90 days supply | Qty: 180 | Fill #2

## 2015-11-27 MED FILL — MONTELUKAST SOD 10 MG TAB: 10 | 90 days supply | Qty: 90 | Fill #1

## 2015-12-28 MED FILL — ALPRAZolam 0.5 MG TABS: 0.5 | 90 days supply | Qty: 180 | Fill #0

## 2016-02-26 MED FILL — DULoxetine HCL 60 MG CPEP: 60 | 90 days supply | Qty: 180 | Fill #3

## 2016-02-26 MED FILL — MONTELUKAST SOD 10 MG TAB: 10 | 90 days supply | Qty: 90 | Fill #2

## 2016-03-12 DIAGNOSIS — F3341 Major depressive disorder, recurrent, in partial remission: Secondary | ICD-10-CM | POA: Diagnosis not present

## 2016-03-12 DIAGNOSIS — F341 Dysthymic disorder: Secondary | ICD-10-CM | POA: Diagnosis not present

## 2016-03-13 MED FILL — traZODone HCL 50 MG TABS: 50 | 90 days supply | Qty: 180 | Fill #0

## 2016-03-14 DIAGNOSIS — R51 Headache: Secondary | ICD-10-CM | POA: Diagnosis not present

## 2016-03-14 DIAGNOSIS — M9901 Segmental and somatic dysfunction of cervical region: Secondary | ICD-10-CM | POA: Diagnosis not present

## 2016-03-14 DIAGNOSIS — J329 Chronic sinusitis, unspecified: Secondary | ICD-10-CM | POA: Diagnosis not present

## 2016-03-14 DIAGNOSIS — M9903 Segmental and somatic dysfunction of lumbar region: Secondary | ICD-10-CM | POA: Diagnosis not present

## 2016-03-20 DIAGNOSIS — R51 Headache: Secondary | ICD-10-CM | POA: Diagnosis not present

## 2016-03-20 DIAGNOSIS — J329 Chronic sinusitis, unspecified: Secondary | ICD-10-CM | POA: Diagnosis not present

## 2016-05-01 DIAGNOSIS — R51 Headache: Secondary | ICD-10-CM | POA: Diagnosis not present

## 2016-05-01 DIAGNOSIS — J329 Chronic sinusitis, unspecified: Secondary | ICD-10-CM | POA: Diagnosis not present

## 2016-05-02 MED FILL — EVAMIST 1.53 MG/SPRAY: 1.53 | 90 days supply | Qty: 24 | Fill #1

## 2016-05-06 DIAGNOSIS — H5203 Hypermetropia, bilateral: Secondary | ICD-10-CM | POA: Diagnosis not present

## 2016-05-23 MED FILL — DULoxetine HCL 60 MG CPEP: 60 | 90 days supply | Qty: 180 | Fill #0

## 2016-05-23 MED FILL — MONTELUKAST SOD 10 MG TAB: 10 | 90 days supply | Qty: 90 | Fill #3

## 2016-06-24 ENCOUNTER — Ambulatory Visit (HOSPITAL_COMMUNITY)
Admission: EM | Admit: 2016-06-24 | Discharge: 2016-06-24 | Disposition: A | Discharge status: Home / Self Care | Payer: 59 | Attending: Emergency Medicine | Admitting: Emergency Medicine

## 2016-06-24 ENCOUNTER — Encounter (HOSPITAL_COMMUNITY): Payer: Self-pay | Admitting: Emergency Medicine

## 2016-06-24 DIAGNOSIS — Z01419 Encounter for gynecological examination (general) (routine) without abnormal findings: Secondary | ICD-10-CM | POA: Diagnosis not present

## 2016-06-24 DIAGNOSIS — H1032 Unspecified acute conjunctivitis, left eye: Secondary | ICD-10-CM

## 2016-06-24 DIAGNOSIS — Z1231 Encounter for screening mammogram for malignant neoplasm of breast: Secondary | ICD-10-CM | POA: Diagnosis not present

## 2016-06-24 DIAGNOSIS — Z6827 Body mass index (BMI) 27.0-27.9, adult: Secondary | ICD-10-CM | POA: Diagnosis not present

## 2016-06-24 MED ORDER — FLUORESCEIN SODIUM 0.6 MG OP STRP
ORAL_STRIP | OPHTHALMIC | Status: AC
  Filled 2016-06-24: qty 1

## 2016-06-24 MED ORDER — TOBRAMYCIN 0.3 % OP SOLN: 1.0000 [drp] | OPHTHALMIC | 0 refills | Status: DC

## 2016-06-24 MED ORDER — TETRACAINE HCL 0.5 % OP SOLN
OPHTHALMIC | Status: AC
  Filled 2016-06-24: qty 2

## 2016-06-24 MED FILL — TOBRAMYCIN 0.3% EYE DROPS: 0.3 | 16 days supply | Qty: 5 | Fill #0

## 2016-06-24 NOTE — ED Triage Notes (Signed)
Patient does not wear contacts, but does wear corrective lenses.  Left eye issues that started yesterday evening.  No known injury.  Initially eye was hurting, then redness, sore to the touch.  No change in vision.

## 2016-06-24 NOTE — ED Provider Notes (Signed)
CSN: [id number]     Arrival date & time 06/24/16  1204 History   None    Chief Complaint  Patient presents with  . Eye Problem   (Consider location/radiation/quality/duration/timing/severity/associated sxs/prior Treatment) The history is provided by the patient. No language interpreter was used.  Eye Problem  Location:  Left eye Quality:  Aching Severity:  Moderate Onset quality:  Gradual Duration:  1 day Timing:  Constant Progression:  Worsening Chronicity:  New Relieved by:  Nothing Worsened by:  Nothing Ineffective treatments:  None tried Associated symptoms: redness   Risk factors: no conjunctival hemorrhage     Past Medical History:  Diagnosis Date  . Asthma    recurrence @ age 52; ? childhood asthma as EIB  . GERD (gastroesophageal reflux disease) 1995   hiatal hernia    Past Surgical History:  Procedure Laterality Date  . ABDOMINAL HYSTERECTOMY  1997   uterine adenoma & dysmenorrhea  . APPENDECTOMY    . COLONOSCOPY  2010   negative, Dr Cristina Gong  . tonsillectomy     Family History  Problem Relation Age of Onset  . Breast cancer Mother   . Asthma Mother        asthmatic bronchitis  . Heart attack Father 96       3 vessel CABG  . Hashimoto's thyroiditis Sister   . Autism Brother   . Diabetes Paternal Grandmother   . Heart failure Paternal Grandmother        in 56s  . Diabetes Paternal Grandfather   . Heart attack Paternal Grandfather 68  . Heart attack Paternal Uncle 36  . Coronary artery disease Unknown        paternal FH  . Stroke Neg Hx   . COPD Neg Hx    Social History  Substance Use Topics  . Smoking status: Never Smoker  . Smokeless tobacco: Never Used     Comment: second hand smoke X 25 years  . Alcohol use 1.2 oz/week    2 Shots of liquor per week     Comment:  < 2/ week   OB History    No data available     Review of Systems  Eyes: Positive for redness.  All other systems reviewed and are negative.   Allergies  Bupropion  hcl  Home Medications   Prior to Admission medications   Medication Sig Start Date End Date Taking? Authorizing Provider  cetirizine (ZYRTEC) 10 MG tablet Take 10 mg by mouth daily.     Yes [provider]  DULoxetine (CYMBALTA) 60 MG capsule Take 120 mg by mouth daily. 2 by mouth daily     Yes [provider]  folic acid (FOLVITE) 774 MCG tablet Take 400 mcg by mouth daily as needed (supplement).    Yes [provider]  montelukast (SINGULAIR) 10 MG tablet Take 1 tablet (10 mg total) by mouth daily. 06/09/15  Yes Hoyt Koch, MD  Acetylcysteine (N-ACETYL-L-CYSTEINE) 600 MG CAPS Take by mouth.    [provider]  albuterol (PROVENTIL HFA;VENTOLIN HFA) 108 (90 BASE) MCG/ACT inhaler Inhale 2 puffs into the lungs every 4 (four) hours as needed for wheezing or shortness of breath (wheezing or shortness of breath).    [provider]  ALPRAZolam Duanne Moron) 0.5 MG tablet Take 0.5 mg by mouth as needed (anxiety).     [provider]  AMBULATORY NON FORMULARY MEDICATION Apply 1 spray topically daily as needed (evamist - hot flashes). Medication Name: Evamist  (estrogen  spray), rx'ed by GYN    [provider]  Ascorbic Acid (VITAMIN C) 1000 MG tablet Take 1,000 mg by mouth daily as needed (vitamin supplement).     [provider]  budesonide-formoterol (SYMBICORT) 160-4.5 MCG/ACT inhaler Inhale 2 puffs into the lungs 2 (two) times daily. Patient not taking: Reported on 11/02/2015 07/08/14   Hendricks Limes, MD  Cholecalciferol (VITAMIN D3) 2000 UNITS TABS Take 2,000 Units by mouth daily as needed (vitamin supplement).     [provider]  fluticasone (FLONASE) 50 MCG/ACT nasal spray Place 1 spray into both nostrils 2 (two) times daily as needed for allergies or rhinitis (for sinus congestion).    [provider]  Multiple Vitamin (MULTIVITAMIN) tablet Take 1 tablet by mouth daily as needed (supplement).      [provider]  neomycin-polymyxin-hydrocortisone (CORTISPORIN) otic solution Place 3 drops into the left ear 3 (three) times daily. 11/02/15   Hoyt Koch, MD  Nutritional Supplements (IMMUNE ENHANCE) TABS Take 1 tablet by mouth daily as needed (supplement).     [provider]  omeprazole (PRILOSEC) 20 MG capsule Take 1 capsule (20 mg total) by mouth 2 (two) times daily before a meal. 09/07/15 07/12/22  Hoyt Koch, MD  Resveratrol 250 MG CAPS Take 1 capsule by mouth daily as needed (supplement).     [provider]  tobramycin (TOBREX) 0.3 % ophthalmic solution Place 1 drop into the left eye every 4 (four) hours. 06/24/16   Fransico Meadow, PA-C  traZODone (DESYREL) 50 MG tablet Take 50 mg by mouth. 1/2-1 tablet at bedtime     [provider]  TURMERIC PO Take 1 tablet by mouth daily as needed (supplement).     [provider]  UNABLE TO FIND Take 1 tablet by mouth daily as needed (supplement). D Ribose    [provider]  valACYclovir (VALTREX) 500 MG tablet Take 500 mg by mouth 2 (two) times daily as needed (cold sores).     [provider]   Meds Ordered and Administered this Visit  Medications - No data to display  BP 108/87 (BP Location: Right Arm)   Pulse 75   Temp 98.5 F (36.9 C)   SpO2 100%  No data found.   Physical Exam  Constitutional: She appears well-developed and well-nourished.  HENT:  Head: Normocephalic.  Eyes:  Erythema medial aspect of eye. No fluro uptake  No abrasions.  tonopen pressure 8 and 9  Normal   Neck: Normal range of motion.  Cardiovascular: Normal rate.   Pulmonary/Chest: Effort normal.  Musculoskeletal: Normal range of motion.  Neurological: She is alert.  Skin: Skin is warm.  Psychiatric: She has a normal mood and affect.  Nursing note and vitals reviewed.   Urgent Care Course     Procedures (including critical care time)  Labs Review Labs Reviewed - No data  to display  Imaging Review No results found.   Visual Acuity Review  Right Eye Distance: 20/25 Left Eye Distance: 20/30 Bilateral Distance: 20/20 (with corrective lens)  Right Eye Near:   Left Eye Near:    Bilateral Near:         MDM   1. Acute bacterial conjunctivitis of left eye    Meds ordered this encounter  Medications  . tobramycin (TOBREX) 0.3 % ophthalmic solution    Sig: Place 1 drop into the left eye every 4 (four) hours.    Dispense:  5 mL  Refill:  0    Order Specific Question:   Supervising Provider    Answer:   Melynda Ripple [[id number]]   An After Visit Summary was printed and given to the patient.     Fransico Meadow, [place] 06/24/16 1956

## 2016-06-24 NOTE — Discharge Instructions (Signed)
Return if any problems.

## 2016-06-27 DIAGNOSIS — H20012 Primary iridocyclitis, left eye: Secondary | ICD-10-CM | POA: Diagnosis not present

## 2016-06-27 MED FILL — DUREZOL 0.05% EYE DROPS: 0.05 | 15 days supply | Qty: 5 | Fill #0

## 2016-07-02 DIAGNOSIS — H20012 Primary iridocyclitis, left eye: Secondary | ICD-10-CM | POA: Diagnosis not present

## 2016-07-15 MED FILL — VALACYCLOVIR HCL 500 MG TAB: 500 | 30 days supply | Qty: 30 | Fill #0

## 2016-08-12 DIAGNOSIS — R51 Headache: Secondary | ICD-10-CM | POA: Diagnosis not present

## 2016-08-12 DIAGNOSIS — J329 Chronic sinusitis, unspecified: Secondary | ICD-10-CM | POA: Diagnosis not present

## 2016-08-26 ENCOUNTER — Other Ambulatory Visit: Payer: Self-pay | Admitting: Internal Medicine

## 2016-08-26 MED FILL — DULoxetine HCL 60 MG CPEP: 60 | 90 days supply | Qty: 180 | Fill #1

## 2016-08-26 NOTE — Telephone Encounter (Signed)
Routing to Dr. Alain Marion. Patient hasnt been seen since October of 2017 and last wellness was may of 2017. please advise in the absence of Dr. Sharlet Salina. thanks

## 2016-08-27 MED FILL — MONTELUKAST SOD 10 MG TAB: 10 | 90 days supply | Qty: 90 | Fill #0

## 2016-09-02 MED FILL — ALPRAZolam 0.5 MG TABS: 0.5 | 90 days supply | Qty: 180 | Fill #0

## 2016-09-04 MED FILL — traZODone HCL 50 MG TABS: 50 | 90 days supply | Qty: 180 | Fill #1

## 2016-09-10 DIAGNOSIS — F3341 Major depressive disorder, recurrent, in partial remission: Secondary | ICD-10-CM | POA: Diagnosis not present

## 2016-09-10 DIAGNOSIS — F341 Dysthymic disorder: Secondary | ICD-10-CM | POA: Diagnosis not present

## 2016-10-30 DIAGNOSIS — J329 Chronic sinusitis, unspecified: Secondary | ICD-10-CM | POA: Diagnosis not present

## 2016-10-30 DIAGNOSIS — R51 Headache: Secondary | ICD-10-CM | POA: Diagnosis not present

## 2016-11-04 MED FILL — VALACYCLOVIR HCL 500 MG TAB: 500 | 30 days supply | Qty: 30 | Fill #1

## 2016-11-15 ENCOUNTER — Other Ambulatory Visit (INDEPENDENT_AMBULATORY_CARE_PROVIDER_SITE_OTHER): Payer: 59

## 2016-11-15 ENCOUNTER — Encounter: Payer: Self-pay | Admitting: Internal Medicine

## 2016-11-15 ENCOUNTER — Ambulatory Visit (INDEPENDENT_AMBULATORY_CARE_PROVIDER_SITE_OTHER): Payer: 59 | Admitting: Internal Medicine

## 2016-11-15 VITALS — BP 116/78 | HR 68 | Temp 97.9°F | Ht 65.5 in | Wt 173.0 lb

## 2016-11-15 DIAGNOSIS — M8580 Other specified disorders of bone density and structure, unspecified site: Secondary | ICD-10-CM

## 2016-11-15 DIAGNOSIS — Z0000 Encounter for general adult medical examination without abnormal findings: Secondary | ICD-10-CM | POA: Diagnosis not present

## 2016-11-15 DIAGNOSIS — E785 Hyperlipidemia, unspecified: Secondary | ICD-10-CM | POA: Diagnosis not present

## 2016-11-15 DIAGNOSIS — J4520 Mild intermittent asthma, uncomplicated: Secondary | ICD-10-CM

## 2016-11-15 DIAGNOSIS — F3342 Major depressive disorder, recurrent, in full remission: Secondary | ICD-10-CM

## 2016-11-15 DIAGNOSIS — Z23 Encounter for immunization: Secondary | ICD-10-CM

## 2016-11-15 LAB — CBC
HCT: 43.5 % (ref 36.0–46.0)
Hemoglobin: 14.4 g/dL (ref 12.0–15.0)
MCHC: 33.1 g/dL (ref 30.0–36.0)
MCV: 94.5 fl (ref 78.0–100.0)
Platelets: 289 10*3/uL (ref 150.0–400.0)
RBC: 4.6 Mil/uL (ref 3.87–5.11)
RDW: 13.4 % (ref 11.5–15.5)
WBC: 4.7 10*3/uL (ref 4.0–10.5)

## 2016-11-15 LAB — COMPREHENSIVE METABOLIC PANEL
ALK PHOS: 41 U/L (ref 39–117)
ALT: 10 U/L (ref 0–35)
AST: 12 U/L (ref 0–37)
Albumin: 4.5 g/dL (ref 3.5–5.2)
BILIRUBIN TOTAL: 0.6 mg/dL (ref 0.2–1.2)
BUN: 13 mg/dL (ref 6–23)
CO2: 29 meq/L (ref 19–32)
CREATININE: 0.82 mg/dL (ref 0.40–1.20)
Calcium: 9.3 mg/dL (ref 8.4–10.5)
Chloride: 105 mEq/L (ref 96–112)
GFR: 75.69 mL/min (ref >60.00)
GLUCOSE: 105 mg/dL — AB (ref 70–99)
Potassium: 4 mEq/L (ref 3.5–5.1)
Sodium: 142 mEq/L (ref 135–145)
TOTAL PROTEIN: 6.8 g/dL (ref 6.0–8.3)

## 2016-11-15 LAB — LIPID PANEL
CHOL/HDL RATIO: 3
Cholesterol: 190 mg/dL (ref 0–200)
HDL: 62.9 mg/dL (ref >39.00)
LDL Cholesterol: 111 mg/dL — ABNORMAL HIGH (ref 0–99)
NONHDL: 126.7
Triglycerides: 79 mg/dL (ref 0.0–149.0)
VLDL: 15.8 mg/dL (ref 0.0–40.0)

## 2016-11-15 LAB — VITAMIN D 25 HYDROXY (VIT D DEFICIENCY, FRACTURES): VITD: 44.68 ng/mL (ref 30.00–100.00)

## 2016-11-15 NOTE — Assessment & Plan Note (Signed)
Sees psych and taking meds per them. Stable at this time.

## 2016-11-15 NOTE — Assessment & Plan Note (Signed)
Mammogram done at gyn Dr. Matthew Saras as well as bone density. Given tdap today. Flu shot up to date. Added to waiting list for shingrix. Counseled about sun safety and mole surveillance. Counseled about the dangers of distracted driving. Given screening recommendations. Colonoscopy due in 2020.

## 2016-11-15 NOTE — Patient Instructions (Signed)
We have given you the tetanus shot today and placed you on the waiting list for shingles.   Health Maintenance, Female Adopting a healthy lifestyle and getting preventive care can go a long way to promote health and wellness. Talk with your health care provider about what schedule of regular examinations is right for you. This is a good chance for you to check in with your provider about disease prevention and staying healthy. In between checkups, there are plenty of things you can do on your own. Experts have done a lot of research about which lifestyle changes and preventive measures are most likely to keep you healthy. Ask your health care provider for more information. Weight and diet Eat a healthy diet  Be sure to include plenty of vegetables, fruits, low-fat dairy products, and lean protein.  Do not eat a lot of foods high in solid fats, added sugars, or salt.  Get regular exercise. This is one of the most important things you can do for your health. ? Most adults should exercise for at least 150 minutes each week. The exercise should increase your heart rate and make you sweat (moderate-intensity exercise). ? Most adults should also do strengthening exercises at least twice a week. This is in addition to the moderate-intensity exercise.  Maintain a healthy weight  Body mass index (BMI) is a measurement that can be used to identify possible weight problems. It estimates body fat based on height and weight. Your health care provider can help determine your BMI and help you achieve or maintain a healthy weight.  For females 68 years of age and older: ? A BMI below 18.5 is considered underweight. ? A BMI of 18.5 to 24.9 is normal. ? A BMI of 25 to 29.9 is considered overweight. ? A BMI of 30 and above is considered obese.  Watch levels of cholesterol and blood lipids  You should start having your blood tested for lipids and cholesterol at 59 years of age, then have this test every 5  years.  You may need to have your cholesterol levels checked more often if: ? Your lipid or cholesterol levels are high. ? You are older than 59 years of age. ? You are at high risk for heart disease.  Cancer screening Lung Cancer  Lung cancer screening is recommended for adults 31-27 years old who are at high risk for lung cancer because of a history of smoking.  A yearly low-dose CT scan of the lungs is recommended for people who: ? Currently smoke. ? Have quit within the past 15 years. ? Have at least a 30-pack-year history of smoking. A pack year is smoking an average of one pack of cigarettes a day for 1 year.  Yearly screening should continue until it has been 15 years since you quit.  Yearly screening should stop if you develop a health problem that would prevent you from having lung cancer treatment.  Breast Cancer  Practice breast self-awareness. This means understanding how your breasts normally appear and feel.  It also means doing regular breast self-exams. Let your health care provider know about any changes, no matter how small.  If you are in your 20s or 30s, you should have a clinical breast exam (CBE) by a health care provider every 1-3 years as part of a regular health exam.  If you are 57 or older, have a CBE every year. Also consider having a breast X-ray (mammogram) every year.  If you have a family history  of breast cancer, talk to your health care provider about genetic screening.  If you are at high risk for breast cancer, talk to your health care provider about having an MRI and a mammogram every year.  Breast cancer gene (BRCA) assessment is recommended for women who have family members with BRCA-related cancers. BRCA-related cancers include: ? Breast. ? Ovarian. ? Tubal. ? Peritoneal cancers.  Results of the assessment will determine the need for genetic counseling and BRCA1 and BRCA2 testing.  Cervical Cancer Your health care provider may  recommend that you be screened regularly for cancer of the pelvic organs (ovaries, uterus, and vagina). This screening involves a pelvic examination, including checking for microscopic changes to the surface of your cervix (Pap test). You may be encouraged to have this screening done every 3 years, beginning at age 21.  For women ages 30-65, health care providers may recommend pelvic exams and Pap testing every 3 years, or they may recommend the Pap and pelvic exam, combined with testing for human papilloma virus (HPV), every 5 years. Some types of HPV increase your risk of cervical cancer. Testing for HPV may also be done on women of any age with unclear Pap test results.  Other health care providers may not recommend any screening for nonpregnant women who are considered low risk for pelvic cancer and who do not have symptoms. Ask your health care provider if a screening pelvic exam is right for you.  If you have had past treatment for cervical cancer or a condition that could lead to cancer, you need Pap tests and screening for cancer for at least 20 years after your treatment. If Pap tests have been discontinued, your risk factors (such as having a new sexual partner) need to be reassessed to determine if screening should resume. Some women have medical problems that increase the chance of getting cervical cancer. In these cases, your health care provider may recommend more frequent screening and Pap tests.  Colorectal Cancer  This type of cancer can be detected and often prevented.  Routine colorectal cancer screening usually begins at 59 years of age and continues through 59 years of age.  Your health care provider may recommend screening at an earlier age if you have risk factors for colon cancer.  Your health care provider may also recommend using home test kits to check for hidden blood in the stool.  A small camera at the end of a tube can be used to examine your colon directly  (sigmoidoscopy or colonoscopy). This is done to check for the earliest forms of colorectal cancer.  Routine screening usually begins at age 50.  Direct examination of the colon should be repeated every 5-10 years through 59 years of age. However, you may need to be screened more often if early forms of precancerous polyps or small growths are found.  Skin Cancer  Check your skin from head to toe regularly.  Tell your health care provider about any new moles or changes in moles, especially if there is a change in a mole's shape or color.  Also tell your health care provider if you have a mole that is larger than the size of a pencil eraser.  Always use sunscreen. Apply sunscreen liberally and repeatedly throughout the day.  Protect yourself by wearing long sleeves, pants, a wide-brimmed hat, and sunglasses whenever you are outside.  Heart disease, diabetes, and high blood pressure  High blood pressure causes heart disease and increases the risk of stroke. High   blood pressure is more likely to develop in: ? People who have blood pressure in the high end of the normal range (130-139/85-89 mm Hg). ? People who are overweight or obese. ? People who are African American.  If you are 18-39 years of age, have your blood pressure checked every 3-5 years. If you are 40 years of age or older, have your blood pressure checked every year. You should have your blood pressure measured twice-once when you are at a hospital or clinic, and once when you are not at a hospital or clinic. Record the average of the two measurements. To check your blood pressure when you are not at a hospital or clinic, you can use: ? An automated blood pressure machine at a pharmacy. ? A home blood pressure monitor.  If you are between 55 years and 79 years old, ask your health care provider if you should take aspirin to prevent strokes.  Have regular diabetes screenings. This involves taking a blood sample to check your  fasting blood sugar level. ? If you are at a normal weight and have a low risk for diabetes, have this test once every three years after 59 years of age. ? If you are overweight and have a high risk for diabetes, consider being tested at a younger age or more often. Preventing infection Hepatitis B  If you have a higher risk for hepatitis B, you should be screened for this virus. You are considered at high risk for hepatitis B if: ? You were born in a country where hepatitis B is common. Ask your health care provider which countries are considered high risk. ? Your parents were born in a high-risk country, and you have not been immunized against hepatitis B (hepatitis B vaccine). ? You have HIV or AIDS. ? You use needles to inject street drugs. ? You live with someone who has hepatitis B. ? You have had sex with someone who has hepatitis B. ? You get hemodialysis treatment. ? You take certain medicines for conditions, including cancer, organ transplantation, and autoimmune conditions.  Hepatitis C  Blood testing is recommended for: ? Everyone born from 1945 through 1965. ? Anyone with known risk factors for hepatitis C.  Sexually transmitted infections (STIs)  You should be screened for sexually transmitted infections (STIs) including gonorrhea and chlamydia if: ? You are sexually active and are younger than 59 years of age. ? You are older than 59 years of age and your health care provider tells you that you are at risk for this type of infection. ? Your sexual activity has changed since you were last screened and you are at an increased risk for chlamydia or gonorrhea. Ask your health care provider if you are at risk.  If you do not have HIV, but are at risk, it may be recommended that you take a prescription medicine daily to prevent HIV infection. This is called pre-exposure prophylaxis (PrEP). You are considered at risk if: ? You are sexually active and do not regularly use condoms  or know the HIV status of your partner(s). ? You take drugs by injection. ? You are sexually active with a partner who has HIV.  Talk with your health care provider about whether you are at high risk of being infected with HIV. If you choose to begin PrEP, you should first be tested for HIV. You should then be tested every 3 months for as long as you are taking PrEP. Pregnancy  If you are   premenopausal and you may become pregnant, ask your health care provider about preconception counseling.  If you may become pregnant, take 400 to 800 micrograms (mcg) of folic acid every day.  If you want to prevent pregnancy, talk to your health care provider about birth control (contraception). Osteoporosis and menopause  Osteoporosis is a disease in which the bones lose minerals and strength with aging. This can result in serious bone fractures. Your risk for osteoporosis can be identified using a bone density scan.  If you are 41 years of age or older, or if you are at risk for osteoporosis and fractures, ask your health care provider if you should be screened.  Ask your health care provider whether you should take a calcium or vitamin D supplement to lower your risk for osteoporosis.  Menopause may have certain physical symptoms and risks.  Hormone replacement therapy may reduce some of these symptoms and risks. Talk to your health care provider about whether hormone replacement therapy is right for you. Follow these instructions at home:  Schedule regular health, dental, and eye exams.  Stay current with your immunizations.  Do not use any tobacco products including cigarettes, chewing tobacco, or electronic cigarettes.  If you are pregnant, do not drink alcohol.  If you are breastfeeding, limit how much and how often you drink alcohol.  Limit alcohol intake to no more than 1 drink per day for nonpregnant women. One drink equals 12 ounces of beer, 5 ounces of wine, or 1 ounces of hard  liquor.  Do not use street drugs.  Do not share needles.  Ask your health care provider for help if you need support or information about quitting drugs.  Tell your health care provider if you often feel depressed.  Tell your health care provider if you have ever been abused or do not feel safe at home. This information is not intended to replace advice given to you by your health care provider. Make sure you discuss any questions you have with your health care provider. Document Released: 07/23/2010 Document Revised: 06/15/2015 Document Reviewed: 10/11/2014 Elsevier Interactive Patient Education  [employer].

## 2016-11-15 NOTE — Assessment & Plan Note (Signed)
Checking lipid panel and adjust as needed. Not on meds. Family history of early CAD.

## 2016-11-15 NOTE — Assessment & Plan Note (Signed)
Using albuterol prn and symbicort for symptoms. Taking zyrtec, singulair, flonase for allergies.

## 2016-11-15 NOTE — Assessment & Plan Note (Signed)
DEXA at gyn

## 2016-11-15 NOTE — Progress Notes (Signed)
   Subjective:    Patient ID: Gina Graves, female    DOB: 11/05/1955, 61 y.o.   MRN: [id number]  HPI The patient is a 61 YO female coming in for physical. No new concerns. Dr Matthew Saras does pap, dexa.  PMH, [facility], social history reviewed and updated.   Review of Systems  Constitutional: Negative.   HENT: Positive for congestion, postnasal drip and rhinorrhea. Negative for tinnitus, trouble swallowing and voice change.   Eyes: Negative.   Respiratory: Negative for cough, chest tightness and shortness of breath.   Cardiovascular: Negative for chest pain, palpitations and leg swelling.  Gastrointestinal: Negative for abdominal distention, abdominal pain, constipation, diarrhea, nausea and vomiting.  Musculoskeletal: Negative.   Skin: Negative.   Neurological: Negative.   Psychiatric/Behavioral: Negative.       Objective:   Physical Exam  Constitutional: She is oriented to person, place, and time. She appears well-developed and well-nourished.  HENT:  Head: Normocephalic and atraumatic.  Eyes: EOM are normal.  Neck: Normal range of motion.  Cardiovascular: Normal rate and regular rhythm.   Pulmonary/Chest: Effort normal and breath sounds normal. No respiratory distress. She has no wheezes. She has no rales.  Abdominal: Soft. Bowel sounds are normal. She exhibits no distension. There is no tenderness. There is no rebound.  Musculoskeletal: She exhibits no edema.  Neurological: She is alert and oriented to person, place, and time. Coordination normal.  Skin: Skin is warm and dry.  Psychiatric: She has a normal mood and affect.   Vitals:   11/15/16 0851  BP: 116/78  Pulse: 68  Temp: 97.9 F (36.6 C)  TempSrc: Oral  SpO2: 98%  Weight: 173 lb (78.5 kg)  Height: 5' 5.5" (1.664 m)      Assessment & Plan:  Tdap given at visit

## 2016-11-20 ENCOUNTER — Telehealth: Payer: Self-pay

## 2016-11-20 NOTE — Telephone Encounter (Signed)
Left message asking patient to call in to get scheduled for first shingrix injection, let Marlita Keil know if patient calls to make appt so that both vaccines can be labeled and placed in refrig

## 2016-11-21 ENCOUNTER — Ambulatory Visit (INDEPENDENT_AMBULATORY_CARE_PROVIDER_SITE_OTHER): Payer: 59 | Admitting: General Practice

## 2016-11-21 DIAGNOSIS — Z23 Encounter for immunization: Secondary | ICD-10-CM | POA: Diagnosis not present

## 2016-11-21 NOTE — Telephone Encounter (Signed)
Both vaccines labeled and placed in refrig 

## 2016-11-21 NOTE — Telephone Encounter (Signed)
Pt is coming today at 4:00

## 2016-11-25 MED FILL — MONTELUKAST SOD 10 MG TAB: 10 | 90 days supply | Qty: 90 | Fill #1

## 2016-11-25 MED FILL — DULoxetine HCL 60 MG CPEP: 60 | 90 days supply | Qty: 180 | Fill #2

## 2016-12-05 MED FILL — ALPRAZolam 0.5 MG TABS: 0.5 | 90 days supply | Qty: 180 | Fill #0

## 2016-12-26 ENCOUNTER — Other Ambulatory Visit: Payer: Self-pay | Admitting: Internal Medicine

## 2016-12-26 DIAGNOSIS — J4541 Moderate persistent asthma with (acute) exacerbation: Secondary | ICD-10-CM

## 2016-12-30 ENCOUNTER — Other Ambulatory Visit: Payer: Self-pay | Admitting: Internal Medicine

## 2016-12-30 DIAGNOSIS — J4541 Moderate persistent asthma with (acute) exacerbation: Secondary | ICD-10-CM

## 2017-01-02 ENCOUNTER — Other Ambulatory Visit: Payer: Self-pay

## 2017-01-02 DIAGNOSIS — J4541 Moderate persistent asthma with (acute) exacerbation: Secondary | ICD-10-CM

## 2017-01-02 MED ORDER — BUDESONIDE-FORMOTEROL FUMARATE 160-4.5 MCG/ACT IN AERO: 2.0000 | INHALATION_SPRAY | Freq: Two times a day (BID) | RESPIRATORY_TRACT | 3 refills | Status: DC

## 2017-01-02 MED FILL — SYMBICORT 160-4.5 MCG INH: 160-4.5 | 30 days supply | Qty: 10 | Fill #0

## 2017-01-23 ENCOUNTER — Ambulatory Visit (INDEPENDENT_AMBULATORY_CARE_PROVIDER_SITE_OTHER): Payer: 59 | Admitting: *Deleted

## 2017-01-23 DIAGNOSIS — Z23 Encounter for immunization: Secondary | ICD-10-CM | POA: Diagnosis not present

## 2017-02-05 DIAGNOSIS — H20012 Primary iridocyclitis, left eye: Secondary | ICD-10-CM | POA: Diagnosis not present

## 2017-02-06 ENCOUNTER — Other Ambulatory Visit (INDEPENDENT_AMBULATORY_CARE_PROVIDER_SITE_OTHER): Payer: 59

## 2017-02-06 ENCOUNTER — Ambulatory Visit (INDEPENDENT_AMBULATORY_CARE_PROVIDER_SITE_OTHER): Payer: 59 | Admitting: Internal Medicine

## 2017-02-06 ENCOUNTER — Encounter: Payer: Self-pay | Admitting: Internal Medicine

## 2017-02-06 VITALS — BP 104/80 | HR 71 | Temp 98.1°F | Ht 65.5 in | Wt 171.0 lb

## 2017-02-06 DIAGNOSIS — M2540 Effusion, unspecified joint: Secondary | ICD-10-CM

## 2017-02-06 LAB — TSH: TSH: 1.4 u[IU]/mL (ref 0.35–4.50)

## 2017-02-06 LAB — T4, FREE: Free T4: 0.92 ng/dL (ref 0.60–1.60)

## 2017-02-06 NOTE — Progress Notes (Signed)
   Subjective:    Patient ID: Gina Graves, female    DOB: 12/30/1955, 62 y.o.   MRN: [id number]  HPI The patient is a 62 YO female coming in for concerns about auto-immune disease. She has had several episodes of uveitis and her eye specialist is concerned about underlying disease. Her mother had ankylosing spondylitis. She does have some joint swelling and redness that comes and goes. She notices this mostly in her hands and the thumb joints.   Review of Systems  Constitutional: Negative.   HENT: Negative.   Eyes: Negative.   Respiratory: Negative for cough, chest tightness and shortness of breath.   Cardiovascular: Negative for chest pain, palpitations and leg swelling.  Gastrointestinal: Negative for abdominal distention, abdominal pain, constipation, diarrhea, nausea and vomiting.  Musculoskeletal: Positive for arthralgias, joint swelling and myalgias.  Skin: Negative.   Neurological: Negative.   Psychiatric/Behavioral: Negative.       Objective:   Physical Exam  Constitutional: She is oriented to person, place, and time. She appears well-developed and well-nourished.  HENT:  Head: Normocephalic and atraumatic.  Eyes: EOM are normal.  Neck: Normal range of motion.  Cardiovascular: Normal rate and regular rhythm.  Pulmonary/Chest: Effort normal and breath sounds normal. No respiratory distress. She has no wheezes. She has no rales.  Abdominal: Soft. Bowel sounds are normal. She exhibits no distension. There is no tenderness. There is no rebound.  Musculoskeletal: She exhibits no edema.  No joint swelling on exam today  Neurological: She is alert and oriented to person, place, and time. Coordination normal.  Skin: Skin is warm and dry.   Vitals:   02/06/17 1041  BP: 104/80  Pulse: 71  Temp: 98.1 F (36.7 C)  TempSrc: Oral  SpO2: 99%  Weight: 171 lb (77.6 kg)  Height: 5' 5.5" (1.664 m)      Assessment & Plan:

## 2017-02-06 NOTE — Patient Instructions (Signed)
We will check the labs today for the autoimmune disease.

## 2017-02-06 NOTE — Assessment & Plan Note (Signed)
Given family history of personal history of uveitis will check auto-immune workup.

## 2017-02-09 LAB — ANTI-NUCLEAR AB-TITER (ANA TITER): ANA Titer 1: 1:40 {titer} — ABNORMAL HIGH

## 2017-02-09 LAB — HLA-B27 ANTIGEN: HLA-B27 Antigen: NEGATIVE

## 2017-02-09 LAB — SJOGRENS SYNDROME-B EXTRACTABLE NUCLEAR ANTIBODY: SSB (LA) (ENA) ANTIBODY, IGG: NEGATIVE AI

## 2017-02-09 LAB — ANA: Anti Nuclear Antibody(ANA): POSITIVE — AB

## 2017-02-09 LAB — RHEUMATOID FACTOR

## 2017-02-09 LAB — SJOGRENS SYNDROME-A EXTRACTABLE NUCLEAR ANTIBODY: SSA (RO) (ENA) ANTIBODY, IGG: NEGATIVE AI

## 2017-02-22 ENCOUNTER — Telehealth: Payer: 59 | Admitting: Nurse Practitioner

## 2017-02-22 DIAGNOSIS — N3000 Acute cystitis without hematuria: Secondary | ICD-10-CM | POA: Diagnosis not present

## 2017-02-22 MED ORDER — CIPROFLOXACIN HCL 500 MG PO TABS: 500.0000 mg | ORAL_TABLET | Freq: Two times a day (BID) | ORAL | 0 refills | Status: DC

## 2017-02-22 NOTE — Progress Notes (Signed)

## 2017-02-24 DIAGNOSIS — S92505A Nondisplaced unspecified fracture of left lesser toe(s), initial encounter for closed fracture: Secondary | ICD-10-CM | POA: Diagnosis not present

## 2017-02-25 MED FILL — DULoxetine HCL 60 MG CPEP: 60 | 90 days supply | Qty: 180 | Fill #3

## 2017-02-25 MED FILL — traZODone HCL 50 MG TABS: 50 | 90 days supply | Qty: 180 | Fill #2

## 2017-02-25 MED FILL — MONTELUKAST SOD 10 MG TAB: 10 | 90 days supply | Qty: 90 | Fill #2

## 2017-02-28 MED FILL — SYMBICORT 160-4.5 MCG INH: 160-4.5 | 30 days supply | Qty: 10 | Fill #1

## 2017-03-05 DIAGNOSIS — F341 Dysthymic disorder: Secondary | ICD-10-CM | POA: Diagnosis not present

## 2017-03-05 DIAGNOSIS — F3341 Major depressive disorder, recurrent, in partial remission: Secondary | ICD-10-CM | POA: Diagnosis not present

## 2017-03-05 MED FILL — ALPRAZolam 0.5 MG TABS: 0.5 | 90 days supply | Qty: 180 | Fill #0

## 2017-03-24 DIAGNOSIS — S92505D Nondisplaced unspecified fracture of left lesser toe(s), subsequent encounter for fracture with routine healing: Secondary | ICD-10-CM | POA: Diagnosis not present

## 2017-04-01 MED FILL — EVAMIST 1.53 MG/SPRAY: 1.53 | 56 days supply | Qty: 8 | Fill #0

## 2017-04-21 DIAGNOSIS — R51 Headache: Secondary | ICD-10-CM | POA: Diagnosis not present

## 2017-04-21 DIAGNOSIS — J329 Chronic sinusitis, unspecified: Secondary | ICD-10-CM | POA: Diagnosis not present

## 2017-05-22 DIAGNOSIS — M9902 Segmental and somatic dysfunction of thoracic region: Secondary | ICD-10-CM | POA: Diagnosis not present

## 2017-05-22 DIAGNOSIS — M9901 Segmental and somatic dysfunction of cervical region: Secondary | ICD-10-CM | POA: Diagnosis not present

## 2017-05-22 DIAGNOSIS — J329 Chronic sinusitis, unspecified: Secondary | ICD-10-CM | POA: Diagnosis not present

## 2017-05-23 MED FILL — MONTELUKAST SOD 10 MG TAB: 10 | 90 days supply | Qty: 90 | Fill #3

## 2017-05-26 MED FILL — VALACYCLOVIR HCL 500 MG TAB: 500 | 30 days supply | Qty: 30 | Fill #2

## 2017-05-26 MED FILL — DULoxetine HCL 60 MG CPEP: 60 | 90 days supply | Qty: 180 | Fill #0

## 2017-05-27 DIAGNOSIS — M9902 Segmental and somatic dysfunction of thoracic region: Secondary | ICD-10-CM | POA: Diagnosis not present

## 2017-05-27 DIAGNOSIS — J329 Chronic sinusitis, unspecified: Secondary | ICD-10-CM | POA: Diagnosis not present

## 2017-05-27 DIAGNOSIS — M9901 Segmental and somatic dysfunction of cervical region: Secondary | ICD-10-CM | POA: Diagnosis not present

## 2017-06-03 DIAGNOSIS — J329 Chronic sinusitis, unspecified: Secondary | ICD-10-CM | POA: Diagnosis not present

## 2017-06-03 DIAGNOSIS — M9902 Segmental and somatic dysfunction of thoracic region: Secondary | ICD-10-CM | POA: Diagnosis not present

## 2017-06-03 DIAGNOSIS — M9901 Segmental and somatic dysfunction of cervical region: Secondary | ICD-10-CM | POA: Diagnosis not present

## 2017-06-03 MED FILL — EVAMIST 1.53 MG/SPRAY: 1.53 | 56 days supply | Qty: 8 | Fill #1

## 2017-06-06 DIAGNOSIS — M9901 Segmental and somatic dysfunction of cervical region: Secondary | ICD-10-CM | POA: Diagnosis not present

## 2017-06-06 DIAGNOSIS — M9902 Segmental and somatic dysfunction of thoracic region: Secondary | ICD-10-CM | POA: Diagnosis not present

## 2017-06-06 DIAGNOSIS — J329 Chronic sinusitis, unspecified: Secondary | ICD-10-CM | POA: Diagnosis not present

## 2017-06-08 ENCOUNTER — Encounter: Payer: Self-pay | Admitting: Internal Medicine

## 2017-06-08 DIAGNOSIS — M2560 Stiffness of unspecified joint, not elsewhere classified: Secondary | ICD-10-CM

## 2017-06-10 DIAGNOSIS — M9901 Segmental and somatic dysfunction of cervical region: Secondary | ICD-10-CM | POA: Diagnosis not present

## 2017-06-10 DIAGNOSIS — M9902 Segmental and somatic dysfunction of thoracic region: Secondary | ICD-10-CM | POA: Diagnosis not present

## 2017-06-10 DIAGNOSIS — J329 Chronic sinusitis, unspecified: Secondary | ICD-10-CM | POA: Diagnosis not present

## 2017-06-12 DIAGNOSIS — M9902 Segmental and somatic dysfunction of thoracic region: Secondary | ICD-10-CM | POA: Diagnosis not present

## 2017-06-12 DIAGNOSIS — J329 Chronic sinusitis, unspecified: Secondary | ICD-10-CM | POA: Diagnosis not present

## 2017-06-12 DIAGNOSIS — M9901 Segmental and somatic dysfunction of cervical region: Secondary | ICD-10-CM | POA: Diagnosis not present

## 2017-07-23 NOTE — Progress Notes (Signed)
Office Visit Note  Patient: Gina Graves             Date of Birth: 30-Nov-1955           MRN: [id number]             PCP: Hoyt Koch, MD Referring: Hoyt Koch, * Visit Date: 08/05/2017 Occupation: research co-ordinator    Subjective:  History of recurrent uveitis, arthralgias and myalgias   History of Present Illness: Gina Graves is a 62 y.o. female seen in consultation per request of her PCP.  According to patient her symptoms started in July 2018 patient developed eye redness sensitivity and pain.  She was seen by ophthalmologist who diagnosed her with uveitis and was treated with a steroid eyedrops.  She had recurrence of uveitis for 5 months later which was again treated with eyedrops.  She states she has had muscle pain for several years and she cannot she may have possible fibromyalgia.  She states in February 2019 she fell and fractured her left low.  A month later she started having increased muscle pain neck and shoulder pain.  She states she went to a chiropractor for several weeks and her symptoms persist.  She started experiencing increased fatigue and increased stiffness in her shoulders and hips.  She did some reading and was concerned about possible polymyalgia rheumatica.  She says the symptoms lasted for about 8 weeks and then resolved.  Requested a referral to rheumatologist.  She states that hyperalgesia and the pain across the shoulders persist.  She also has muscle pain.  She is believes that she may have arthritis in her hands that she continues to have some discomfort in her hands.  None of the other joints are painful or swollen.  There is positive family history of ankylosing spondylitis in her mother.  Activities of Daily Living:  Patient reports morning stiffness for 30 minutes.   Patient Reports nocturnal pain.  Difficulty dressing/grooming: Denies Difficulty climbing stairs: Denies Difficulty getting out of chair:  Denies Difficulty using hands for taps, buttons, cutlery, and/or writing: Reports   Review of Systems  Constitutional: Negative for fatigue, fever, night sweats, weight gain and weight loss.  HENT: Positive for mouth dryness. Negative for mouth sores, trouble swallowing, trouble swallowing and nose dryness.        Related to medications  Eyes: Positive for dryness. Negative for pain, redness and visual disturbance.  Respiratory: Positive for shortness of breath. Negative for cough and difficulty breathing.        History of asthma and allergies   Cardiovascular: Negative for chest pain, palpitations, hypertension, irregular heartbeat and swelling in legs/feet.  Gastrointestinal: Negative for blood in stool, constipation and diarrhea.  Endocrine: Negative for increased urination.  Genitourinary: Negative for vaginal dryness.  Musculoskeletal: Positive for arthralgias, joint pain, myalgias, morning stiffness, muscle tenderness and myalgias. Negative for joint swelling and muscle weakness.  Skin: Negative for color change, rash, hair loss, skin tightness, ulcers and sensitivity to sunlight.  Allergic/Immunologic: Negative for susceptible to infections.  Neurological: Negative for dizziness, numbness, headaches, memory loss, night sweats and weakness.  Hematological: Negative for swollen glands.  Psychiatric/Behavioral: Positive for sleep disturbance. Negative for depressed mood. The patient is not nervous/anxious.     PMFS History:  Patient Active Problem List   Diagnosis Date Noted  . Joint swelling 02/06/2017  . Routine general medical examination at a health care facility 06/09/2015  . Heart murmur 06/22/2014  . Hyperlipidemia  03/13/2014  . Family history of early CAD 03/04/2014  . Osteopenia 05/10/2009  . Asthma, mild intermittent 04/25/2008  . Depression 01/28/2007  . Allergic rhinitis 01/28/2007    Past Medical History:  Diagnosis Date  . Asthma    recurrence @ age 54; ?  childhood asthma as EIB  . GERD (gastroesophageal reflux disease) 1995   hiatal hernia     Family History  Problem Relation Age of Onset  . Breast cancer Mother   . Asthma Mother        asthmatic bronchitis  . Heart attack Father 39       3 vessel CABG  . Hashimoto's thyroiditis Sister   . Autism Brother   . Diabetes Paternal Grandmother   . Heart failure Paternal Grandmother        in 54s  . Diabetes Paternal Grandfather   . Heart attack Paternal Grandfather 14  . Heart attack Paternal Uncle 77  . Coronary artery disease Unknown        paternal FH  . Stroke Neg Hx   . COPD Neg Hx    Past Surgical History:  Procedure Laterality Date  . ABDOMINAL HYSTERECTOMY  1997   uterine adenoma & dysmenorrhea  . APPENDECTOMY    . COLONOSCOPY  2010   negative, Dr Cristina Gong  . tonsillectomy     Social History   Social History Narrative  . Not on file     Objective: Vital Signs: BP 119/86 (BP Location: Right Arm, Patient Position: Sitting, Cuff Size: Normal)   Pulse 81   Ht '5\' 6"'  (1.676 m)   Wt 172 lb (78 kg)   BMI 27.76 kg/m    Physical Exam  Constitutional: She is oriented to person, place, and time. She appears well-developed and well-nourished.  HENT:  Head: Normocephalic and atraumatic.  Eyes: Conjunctivae and EOM are normal.  Neck: Normal range of motion.  Cardiovascular: Normal rate, regular rhythm, normal heart sounds and intact distal pulses.  Pulmonary/Chest: Effort normal and breath sounds normal.  Abdominal: Soft. Bowel sounds are normal.  Lymphadenopathy:    She has no cervical adenopathy.  Neurological: She is alert and oriented to person, place, and time.  Skin: Skin is warm and dry. Capillary refill takes less than 2 seconds.  Psychiatric: She has a normal mood and affect. Her behavior is normal.  Nursing note and vitals reviewed.    Musculoskeletal Exam: C-spine lumbar spine.  Shoulder joints elbow joints wrist joint.  She has mild tenderness over the  [facility] and PIP joints.  Hip joints knee joints ankles MTPs PIPs were in good range of motion with no synovitis.  She has mild tenderness over bilateral trapezius area and bilateral trochanteric bursa area.  CDAI Exam: No CDAI exam completed.    Investigation: No additional findings. February 06, 2017 ANA 1: 40 nucleolar, SSA negative, SSA negative, RF negative, HLA-B27 negative, TSH normal  Imaging: No results found.  Speciality Comments: No specialty comments available.    Procedures:  No procedures performed Allergies: Bupropion hcl   Assessment / Plan:     Joint stiffness -patient reports that she has some joint stiffness after prolonged sitting but had no synovitis on examination today.    myalgia-she has episode of myalgia lasting for weeks and then completely resolved.  She believes she may have myofascial pain as she does have hyperalgesia and few tender points.Exercise and good sleep hygiene was discussed.  ANA positive - 02/06/2017 nucleolar 1:40.  ANA titer is not  significant.  Her SSA and SSB was negative.  She believes her sicca symptoms are related to medications.  History of uveitis - per Dr. Nathanial Millman note.  Patient has not had any episodes of uveitis in the last 1 year.  I offered obtaining autoimmune work-up which she would like to wait  until she has recurrence of uveitis.   Maitri but is menstruating on the left lower note  Trochanteric bursitis-bilateral. IT band exercises were discussed.  Handout on exercises was given.  History of osteopenia-she takes calcium and vitamin D.  Family history of ankylosing spondylitis - her mother.   History of hyperlipidemia  History of asthma  History of depression  Heart murmur  History of allergic rhinitis    Orders: No orders of the defined types were placed in this encounter.  No orders of the defined types were placed in this encounter.   Face-to-face time spent with patient was 45 minutes. Greater than 50%  of time was spent in counseling and coordination of care.  Follow-Up Instructions: Return if symptoms worsen or fail to improve, for History of uveitis and joint stiffness .   Bo Merino, MD  Note - This record has been created using Editor, commissioning.  Chart creation errors have been sought, but may not always  have been located. Such creation errors do not reflect on  the standard of medical care.

## 2017-07-28 DIAGNOSIS — Z1231 Encounter for screening mammogram for malignant neoplasm of breast: Secondary | ICD-10-CM | POA: Diagnosis not present

## 2017-07-28 DIAGNOSIS — Z6828 Body mass index (BMI) 28.0-28.9, adult: Secondary | ICD-10-CM | POA: Diagnosis not present

## 2017-07-28 DIAGNOSIS — Z13820 Encounter for screening for osteoporosis: Secondary | ICD-10-CM | POA: Diagnosis not present

## 2017-07-28 DIAGNOSIS — Z01419 Encounter for gynecological examination (general) (routine) without abnormal findings: Secondary | ICD-10-CM | POA: Diagnosis not present

## 2017-07-28 LAB — HM MAMMOGRAPHY

## 2017-08-05 ENCOUNTER — Encounter: Payer: Self-pay | Admitting: Rheumatology

## 2017-08-05 ENCOUNTER — Ambulatory Visit (INDEPENDENT_AMBULATORY_CARE_PROVIDER_SITE_OTHER): Payer: 59 | Admitting: Rheumatology

## 2017-08-05 VITALS — BP 119/86 | HR 81 | Ht 66.0 in | Wt 172.0 lb

## 2017-08-05 DIAGNOSIS — M2560 Stiffness of unspecified joint, not elsewhere classified: Secondary | ICD-10-CM

## 2017-08-05 DIAGNOSIS — Z8739 Personal history of other diseases of the musculoskeletal system and connective tissue: Secondary | ICD-10-CM

## 2017-08-05 DIAGNOSIS — Z8269 Family history of other diseases of the musculoskeletal system and connective tissue: Secondary | ICD-10-CM

## 2017-08-05 DIAGNOSIS — R011 Cardiac murmur, unspecified: Secondary | ICD-10-CM | POA: Diagnosis not present

## 2017-08-05 DIAGNOSIS — Z8659 Personal history of other mental and behavioral disorders: Secondary | ICD-10-CM | POA: Diagnosis not present

## 2017-08-05 DIAGNOSIS — M7061 Trochanteric bursitis, right hip: Secondary | ICD-10-CM

## 2017-08-05 DIAGNOSIS — M791 Myalgia, unspecified site: Secondary | ICD-10-CM | POA: Diagnosis not present

## 2017-08-05 DIAGNOSIS — Z8709 Personal history of other diseases of the respiratory system: Secondary | ICD-10-CM | POA: Diagnosis not present

## 2017-08-05 DIAGNOSIS — M7062 Trochanteric bursitis, left hip: Secondary | ICD-10-CM | POA: Diagnosis not present

## 2017-08-05 DIAGNOSIS — Z8639 Personal history of other endocrine, nutritional and metabolic disease: Secondary | ICD-10-CM

## 2017-08-05 DIAGNOSIS — Z8669 Personal history of other diseases of the nervous system and sense organs: Secondary | ICD-10-CM | POA: Diagnosis not present

## 2017-08-05 DIAGNOSIS — R768 Other specified abnormal immunological findings in serum: Secondary | ICD-10-CM | POA: Diagnosis not present

## 2017-08-21 ENCOUNTER — Other Ambulatory Visit: Payer: Self-pay | Admitting: Internal Medicine

## 2017-08-21 MED FILL — MONTELUKAST SOD 10 MG TAB: 10 | 90 days supply | Qty: 90 | Fill #0

## 2017-08-21 MED FILL — DULoxetine HCL 60 MG CPEP: 60 | 90 days supply | Qty: 180 | Fill #1

## 2017-08-22 MED FILL — traZODone HCL 50 MG TABS: 50 | 90 days supply | Qty: 180 | Fill #0

## 2017-09-02 MED FILL — ALPRAZolam 0.5 MG TABS: 0.5 | 90 days supply | Qty: 180 | Fill #1

## 2017-09-10 ENCOUNTER — Ambulatory Visit: Payer: Self-pay | Admitting: Rheumatology

## 2017-09-22 ENCOUNTER — Encounter: Payer: Self-pay | Admitting: Rheumatology

## 2017-09-29 DIAGNOSIS — J399 Disease of upper respiratory tract, unspecified: Secondary | ICD-10-CM | POA: Diagnosis not present

## 2017-10-01 DIAGNOSIS — J399 Disease of upper respiratory tract, unspecified: Secondary | ICD-10-CM | POA: Diagnosis not present

## 2017-10-20 ENCOUNTER — Encounter: Payer: Self-pay | Admitting: Internal Medicine

## 2017-10-20 ENCOUNTER — Ambulatory Visit (INDEPENDENT_AMBULATORY_CARE_PROVIDER_SITE_OTHER): Payer: 59 | Admitting: Internal Medicine

## 2017-10-20 VITALS — BP 118/78 | HR 87 | Temp 98.2°F | Resp 16 | Ht 66.0 in | Wt 168.4 lb

## 2017-10-20 DIAGNOSIS — H6982 Other specified disorders of Eustachian tube, left ear: Secondary | ICD-10-CM | POA: Insufficient documentation

## 2017-10-20 DIAGNOSIS — J320 Chronic maxillary sinusitis: Secondary | ICD-10-CM | POA: Diagnosis not present

## 2017-10-20 MED ORDER — PREDNISONE 20 MG PO TABS: 40.0000 mg | ORAL_TABLET | Freq: Every day | ORAL | 0 refills | Status: DC

## 2017-10-20 MED ORDER — AMOXICILLIN-POT CLAVULANATE 875-125 MG PO TABS: 1.0000 | ORAL_TABLET | Freq: Two times a day (BID) | ORAL | 0 refills | Status: DC

## 2017-10-20 MED FILL — AMOX-CLAV 875-125 MG TABLET: 875-125 | 10 days supply | Qty: 20 | Fill #0

## 2017-10-20 MED FILL — predniSONE 20 MG TABS: 20 | 5 days supply | Qty: 10 | Fill #0

## 2017-10-20 NOTE — Assessment & Plan Note (Signed)
She likely has a mild, chronic waxing and waning sinus infection Will try Augmentin twice daily x10 days Continue cold and allergy medications We will also prescribe prednisone for possible eustachian tube dysfunction, sinus pain Call if no improvement

## 2017-10-20 NOTE — Progress Notes (Signed)
Subjective:    Patient ID: Gina Graves, female    DOB: [birth year], 62 y.o.   MRN: [id number]  HPI She is here for an acute visit for left ear ache.  Her symptoms started 6 weeks ago.  She got sick at that time and since then she continues to feel sick on and off.    She is experiencing a fullness and inflammation in her ear.  She had a little drainage a couple of times, but not lately.  The ear just doesn't feel right.  The ear has not popped.  She has had some intermittent sinus pain.  She states a mild dry cough.  She is having headaches and dizziness.  She is taking a lot of different cold medications and decongestants.  She does constantly feel sick over the past 6 weeks.  She has a lot of sinusitis in the past .      Medications and allergies reviewed with patient and updated if appropriate.  Patient Active Problem List   Diagnosis Date Noted  . Joint swelling 02/06/2017  . Routine general medical examination at a health care facility 06/09/2015  . Heart murmur 06/22/2014  . Hyperlipidemia 03/13/2014  . Family history of early CAD 03/04/2014  . Osteopenia 05/10/2009  . Asthma, mild intermittent 04/25/2008  . Depression 01/28/2007  . Allergic rhinitis 01/28/2007    Current Outpatient Medications on File Prior to Visit  Medication Sig Dispense Refill  . Acetylcysteine (N-ACETYL-L-CYSTEINE) 600 MG CAPS Take by mouth.    Marland Kitchen albuterol (PROVENTIL HFA;VENTOLIN HFA) 108 (90 BASE) MCG/ACT inhaler Inhale 2 puffs into the lungs every 4 (four) hours as needed for wheezing or shortness of breath (wheezing or shortness of breath).    . ALPRAZolam (XANAX) 0.5 MG tablet Take 0.5 mg by mouth as needed (anxiety).     . AMBULATORY NON FORMULARY MEDICATION Apply 1 spray topically daily as needed (evamist - hot flashes). Medication Name: Evamist  (estrogen spray), rx'ed by GYN    . budesonide-formoterol (SYMBICORT) 160-4.5 MCG/ACT inhaler Inhale 2 puffs into the lungs 2 (two) times  daily. 1 Inhaler 3  . cetirizine (ZYRTEC) 10 MG tablet Take 10 mg by mouth daily.      . Cholecalciferol (VITAMIN D3) 2000 UNITS TABS Take 2,000 Units by mouth daily as needed (vitamin supplement).     . DULoxetine (CYMBALTA) 60 MG capsule Take 120 mg by mouth daily. 2 by mouth daily      . fluticasone (FLONASE) 50 MCG/ACT nasal spray Place 1 spray into both nostrils 2 (two) times daily as needed for allergies or rhinitis (for sinus congestion).    . folic acid (FOLVITE) 856 MCG tablet Take 400 mcg by mouth daily as needed (supplement).     . montelukast (SINGULAIR) 10 MG tablet TAKE 1 TABLET BY MOUTH DAILY. 90 tablet 0  . Multiple Vitamin (MULTIVITAMIN) tablet Take 1 tablet by mouth daily as needed (supplement).     Marland Kitchen omeprazole (PRILOSEC) 20 MG capsule Take 1 capsule (20 mg total) by mouth 2 (two) times daily before a meal. 180 capsule 0  . traZODone (DESYREL) 50 MG tablet Take 50 mg by mouth. 1/2-1 tablet at bedtime     . TURMERIC PO Take 1 tablet by mouth daily as needed (supplement).     . valACYclovir (VALTREX) 500 MG tablet Take 500 mg by mouth 2 (two) times daily as needed (cold sores).      No current facility-administered medications on file prior to  visit.     Past Medical History:  Diagnosis Date  . Asthma    recurrence @ age 68; ? childhood asthma as EIB  . GERD (gastroesophageal reflux disease) 1995   hiatal hernia     Past Surgical History:  Procedure Laterality Date  . ABDOMINAL HYSTERECTOMY  1997   uterine adenoma & dysmenorrhea  . APPENDECTOMY    . COLONOSCOPY  2010   negative, Dr Cristina Gong  . tonsillectomy      Social History   Socioeconomic History  . Marital status: Married    Spouse name: Not on file  . Number of children: Not on file  . Years of education: Not on file  . Highest education level: Not on file  Occupational History  . Not on file  Social Needs  . Financial resource strain: Not on file  . Food insecurity:    Worry: Not on file     Inability: Not on file  . Transportation needs:    Medical: Not on file    Non-medical: Not on file  Tobacco Use  . Smoking status: Never Smoker  . Smokeless tobacco: Never Used  . Tobacco comment: second hand smoke X 25 years  Substance and Sexual Activity  . Alcohol use: Yes    Alcohol/week: 2.0 standard drinks    Types: 2 Shots of liquor per week    Comment:  < 2/ week  . Drug use: No  . Sexual activity: Not on file  Lifestyle  . Physical activity:    Days per week: Not on file    Minutes per session: Not on file  . Stress: Not on file  Relationships  . Social connections:    Talks on phone: Not on file    Gets together: Not on file    Attends religious service: Not on file    Active member of club or organization: Not on file    Attends meetings of clubs or organizations: Not on file    Relationship status: Not on file  Other Topics Concern  . Not on file  Social History Narrative  . Not on file    Family History  Problem Relation Age of Onset  . Breast cancer Mother   . Asthma Mother        asthmatic bronchitis  . Heart attack Father 26       3 vessel CABG  . Hashimoto's thyroiditis Sister   . Autism Brother   . Diabetes Paternal Grandmother   . Heart failure Paternal Grandmother        in 55s  . Diabetes Paternal Grandfather   . Heart attack Paternal Grandfather 70  . Heart attack Paternal Uncle 24  . Coronary artery disease Unknown        paternal FH  . Stroke Neg Hx   . COPD Neg Hx     Review of Systems  Constitutional: Negative for chills and fever.  HENT: Positive for ear discharge (not recently) and sinus pain. Negative for congestion, ear pain (discomfort, fullness in left ear ), sinus pressure and sore throat.        No TMJ  Respiratory: Positive for cough (mild, dry). Negative for shortness of breath and wheezing.   Neurological: Positive for dizziness and headaches. Negative for light-headedness.       Objective:   Vitals:   10/20/17  1307  BP: 118/78  Pulse: 87  Resp: 16  Temp: 98.2 F (36.8 C)  SpO2: 95%   Filed  Weights   10/20/17 1307  Weight: 168 lb 6.4 oz (76.4 kg)   Body mass index is 27.18 kg/m.  Wt Readings from Last 3 Encounters:  10/20/17 168 lb 6.4 oz (76.4 kg)  08/05/17 172 lb (78 kg)  02/06/17 171 lb (77.6 kg)     Physical Exam GENERAL APPEARANCE: Appears stated age, well appearing, NAD EYES: conjunctiva clear, no icterus HEENT: bilateral tympanic membranes and ear canals normal, oropharynx with no erythema, sinus pressure-especially left maxillary sinus, no thyromegaly, trachea midline, no cervical or supraclavicular lymphadenopathy LUNGS: Clear to auscultation without wheeze or crackles, unlabored breathing, good air entry bilaterally CARDIOVASCULAR: Normal S1,S2 without murmurs, no edema SKIN: warm, dry      Assessment & Plan:   See Problem List for Assessment and Plan of chronic medical problems.

## 2017-10-20 NOTE — Assessment & Plan Note (Signed)
Ear exam is normal Symptoms either TMJ related or dysfunction of the eustachian tube She may have an underlying chronic-mild sinus infection Start Augmentin twice daily x10 days Prednisone for 5 days Continue over-the-counter cold medications and decongestants Call if no improvement

## 2017-10-20 NOTE — Patient Instructions (Addendum)
Take the Augmentin and prednisone as prescribed.  You can continue any cold or allergy symptoms as needed.    Call if no improvement      Eustachian Tube Dysfunction The eustachian tube connects the middle ear to the back of the nose. It regulates air pressure in the middle ear by allowing air to move between the ear and nose. It also helps to drain fluid from the middle ear space. When the eustachian tube does not function properly, air pressure, fluid, or both can build up in the middle ear. Eustachian tube dysfunction can affect one or both ears. What are the causes? This condition happens when the eustachian tube becomes blocked or cannot open normally. This may result from:  Ear infections.  Colds and other upper respiratory infections.  Allergies.  Irritation, such as from cigarette smoke or acid from the stomach coming up into the esophagus (gastroesophageal reflux).  Sudden changes in air pressure, such as from descending in an airplane.  Abnormal growths in the nose or throat, such as nasal polyps, tumors, or enlarged tissue at the back of the throat (adenoids).  What increases the risk? This condition may be more likely to develop in people who smoke and people who are overweight. Eustachian tube dysfunction may also be more likely to develop in children, especially children who have:  Certain birth defects of the mouth, such as cleft palate.  Large tonsils and adenoids.  What are the signs or symptoms? Symptoms of this condition may include:  A feeling of fullness in the ear.  Ear pain.  Clicking or popping noises in the ear.  Ringing in the ear.  Hearing loss.  Loss of balance.  Symptoms may get worse when the air pressure around you changes, such as when you travel to an area of high elevation or fly on an airplane. How is this diagnosed? This condition may be diagnosed based on:  Your symptoms.  A physical exam of your ear, nose, and  throat.  Tests, such as those that measure: ? The movement of your eardrum (tympanogram). ? Your hearing (audiometry).  How is this treated? Treatment depends on the cause and severity of your condition. If your symptoms are mild, you may be able to relieve your symptoms by moving air into ("popping") your ears. If you have symptoms of fluid in your ears, treatment may include:  Decongestants.  Antihistamines.  Nasal sprays or ear drops that contain medicines that reduce swelling (steroids).  In some cases, you may need to have a procedure to drain the fluid in your eardrum (myringotomy). In this procedure, a small tube is placed in the eardrum to:  Drain the fluid.  Restore the air in the middle ear space.  Follow these instructions at home:  Take over-the-counter and prescription medicines only as told by your health care provider.  Use techniques to help pop your ears as recommended by your health care provider. These may include: ? Chewing gum. ? Yawning. ? Frequent, forceful swallowing. ? Closing your mouth, holding your nose closed, and gently blowing as if you are trying to blow air out of your nose.  Do not do any of the following until your health care provider approves: ? Travel to high altitudes. ? Fly in airplanes. ? Work in a Pension scheme manager or room. ? Scuba dive.  Keep your ears dry. Dry your ears completely after showering or bathing.  Do not smoke.  Keep all follow-up visits as told by your health  care provider. This is important. Contact a health care provider if:  Your symptoms do not go away after treatment.  Your symptoms come back after treatment.  You are unable to pop your ears.  You have: ? A fever. ? Pain in your ear. ? Pain in your head or neck. ? Fluid draining from your ear.  Your hearing suddenly changes.  You become very dizzy.  You lose your balance. This information is not intended to replace advice given to you by your  health care provider. Make sure you discuss any questions you have with your health care provider. Document Released: 02/03/2015 Document Revised: 06/15/2015 Document Reviewed: 01/26/2014 Elsevier Interactive Patient Education  [employer].

## 2017-11-13 MED FILL — VALACYCLOVIR HCL 500 MG TAB: 500 | 90 days supply | Qty: 90 | Fill #0

## 2017-11-20 ENCOUNTER — Other Ambulatory Visit: Payer: Self-pay | Admitting: Internal Medicine

## 2017-11-20 MED FILL — DULoxetine HCL 60 MG CPEP: 60 | 90 days supply | Qty: 180 | Fill #2

## 2017-11-20 MED FILL — MONTELUKAST SOD 10 MG TAB: 10 | 90 days supply | Qty: 90 | Fill #0

## 2017-11-28 DIAGNOSIS — M9903 Segmental and somatic dysfunction of lumbar region: Secondary | ICD-10-CM | POA: Diagnosis not present

## 2017-11-28 DIAGNOSIS — M9902 Segmental and somatic dysfunction of thoracic region: Secondary | ICD-10-CM | POA: Diagnosis not present

## 2017-11-28 DIAGNOSIS — M9901 Segmental and somatic dysfunction of cervical region: Secondary | ICD-10-CM | POA: Diagnosis not present

## 2017-11-28 DIAGNOSIS — J399 Disease of upper respiratory tract, unspecified: Secondary | ICD-10-CM | POA: Diagnosis not present

## 2017-12-17 ENCOUNTER — Other Ambulatory Visit: Payer: Self-pay | Admitting: Internal Medicine

## 2017-12-17 MED FILL — OMEPRAZOLE 20 MG CPDR: 20 | 90 days supply | Qty: 180 | Fill #0

## 2018-01-19 MED FILL — EVAMIST 1.53 MG/SPRAY: 1.53 | 56 days supply | Qty: 8 | Fill #0

## 2018-01-20 MED FILL — traZODone HCL 50 MG TABS: 50 | 90 days supply | Qty: 180 | Fill #1

## 2018-01-23 MED FILL — ALPRAZolam 0.5 MG TABS: 0.5 | 90 days supply | Qty: 180 | Fill #0

## 2018-01-29 ENCOUNTER — Other Ambulatory Visit: Payer: Self-pay | Admitting: Internal Medicine

## 2018-01-29 DIAGNOSIS — Z0000 Encounter for general adult medical examination without abnormal findings: Secondary | ICD-10-CM

## 2018-01-30 ENCOUNTER — Other Ambulatory Visit (INDEPENDENT_AMBULATORY_CARE_PROVIDER_SITE_OTHER): Payer: 59

## 2018-01-30 DIAGNOSIS — Z0000 Encounter for general adult medical examination without abnormal findings: Secondary | ICD-10-CM

## 2018-01-30 LAB — CBC
HEMATOCRIT: 42.1 % (ref 36.0–46.0)
HEMOGLOBIN: 14.1 g/dL (ref 12.0–15.0)
MCHC: 33.5 g/dL (ref 30.0–36.0)
MCV: 94.1 fl (ref 78.0–100.0)
Platelets: 302 10*3/uL (ref 150.0–400.0)
RBC: 4.48 Mil/uL (ref 3.87–5.11)
RDW: 13.4 % (ref 11.5–15.5)
WBC: 4.2 10*3/uL (ref 4.0–10.5)

## 2018-01-30 LAB — COMPREHENSIVE METABOLIC PANEL
ALT: 10 U/L (ref 0–35)
AST: 13 U/L (ref 0–37)
Albumin: 4.4 g/dL (ref 3.5–5.2)
Alkaline Phosphatase: 41 U/L (ref 39–117)
BUN: 13 mg/dL (ref 6–23)
CO2: 27 meq/L (ref 19–32)
CREATININE: 0.82 mg/dL (ref 0.40–1.20)
Calcium: 9.2 mg/dL (ref 8.4–10.5)
Chloride: 106 mEq/L (ref 96–112)
GFR: 75.38 mL/min (ref >60.00)
GLUCOSE: 90 mg/dL (ref 70–99)
POTASSIUM: 3.9 meq/L (ref 3.5–5.1)
Sodium: 141 mEq/L (ref 135–145)
Total Bilirubin: 0.5 mg/dL (ref 0.2–1.2)
Total Protein: 6.7 g/dL (ref 6.0–8.3)

## 2018-01-30 LAB — LIPID PANEL
CHOLESTEROL: 182 mg/dL (ref 0–200)
HDL: 57.8 mg/dL (ref >39.00)
LDL Cholesterol: 115 mg/dL — ABNORMAL HIGH (ref 0–99)
NONHDL: 124
Total CHOL/HDL Ratio: 3
Triglycerides: 44 mg/dL (ref 0.0–149.0)
VLDL: 8.8 mg/dL (ref 0.0–40.0)

## 2018-02-10 ENCOUNTER — Encounter: Payer: Self-pay | Admitting: Internal Medicine

## 2018-02-10 ENCOUNTER — Ambulatory Visit (INDEPENDENT_AMBULATORY_CARE_PROVIDER_SITE_OTHER)
Admission: RE | Admit: 2018-02-10 | Discharge: 2018-02-10 | Disposition: A | Discharge status: Home / Self Care | Payer: 59 | Source: Ambulatory Visit | Attending: Internal Medicine | Admitting: Internal Medicine

## 2018-02-10 ENCOUNTER — Ambulatory Visit (INDEPENDENT_AMBULATORY_CARE_PROVIDER_SITE_OTHER): Payer: 59 | Admitting: Internal Medicine

## 2018-02-10 VITALS — BP 108/70 | HR 79 | Temp 97.9°F | Ht 66.0 in | Wt 170.0 lb

## 2018-02-10 DIAGNOSIS — J4520 Mild intermittent asthma, uncomplicated: Secondary | ICD-10-CM | POA: Diagnosis not present

## 2018-02-10 DIAGNOSIS — M8580 Other specified disorders of bone density and structure, unspecified site: Secondary | ICD-10-CM | POA: Diagnosis not present

## 2018-02-10 DIAGNOSIS — Z1211 Encounter for screening for malignant neoplasm of colon: Secondary | ICD-10-CM | POA: Diagnosis not present

## 2018-02-10 DIAGNOSIS — M8588 Other specified disorders of bone density and structure, other site: Secondary | ICD-10-CM | POA: Diagnosis not present

## 2018-02-10 DIAGNOSIS — Z0000 Encounter for general adult medical examination without abnormal findings: Secondary | ICD-10-CM | POA: Diagnosis not present

## 2018-02-10 NOTE — Patient Instructions (Signed)
We are getting the bone density, the cologuard and the EKG done today looks normal and the same as last time in 2016.  Health Maintenance, Female Adopting a healthy lifestyle and getting preventive care can go a long way to promote health and wellness. Talk with your health care provider about what schedule of regular examinations is right for you. This is a good chance for you to check in with your provider about disease prevention and staying healthy. In between checkups, there are plenty of things you can do on your own. Experts have done a lot of research about which lifestyle changes and preventive measures are most likely to keep you healthy. Ask your health care provider for more information. Weight and diet Eat a healthy diet  Be sure to include plenty of vegetables, fruits, low-fat dairy products, and lean protein.  Do not eat a lot of foods high in solid fats, added sugars, or salt.  Get regular exercise. This is one of the most important things you can do for your health. ? Most adults should exercise for at least 150 minutes each week. The exercise should increase your heart rate and make you sweat (moderate-intensity exercise). ? Most adults should also do strengthening exercises at least twice a week. This is in addition to the moderate-intensity exercise. Maintain a healthy weight  Body mass index (BMI) is a measurement that can be used to identify possible weight problems. It estimates body fat based on height and weight. Your health care provider can help determine your BMI and help you achieve or maintain a healthy weight.  For females 84 years of age and older: ? A BMI below 18.5 is considered underweight. ? A BMI of 18.5 to 24.9 is normal. ? A BMI of 25 to 29.9 is considered overweight. ? A BMI of 30 and above is considered obese. Watch levels of cholesterol and blood lipids  You should start having your blood tested for lipids and cholesterol at 61 years of age, then  have this test every 5 years.  You may need to have your cholesterol levels checked more often if: ? Your lipid or cholesterol levels are high. ? You are older than 61 years of age. ? You are at high risk for heart disease. Cancer screening Lung Cancer  Lung cancer screening is recommended for adults 66-66 years old who are at high risk for lung cancer because of a history of smoking.  A yearly low-dose CT scan of the lungs is recommended for people who: ? Currently smoke. ? Have quit within the past 15 years. ? Have at least a 30-pack-year history of smoking. A pack year is smoking an average of one pack of cigarettes a day for 1 year.  Yearly screening should continue until it has been 15 years since you quit.  Yearly screening should stop if you develop a health problem that would prevent you from having lung cancer treatment. Breast Cancer  Practice breast self-awareness. This means understanding how your breasts normally appear and feel.  It also means doing regular breast self-exams. Let your health care provider know about any changes, no matter how small.  If you are in your 20s or 30s, you should have a clinical breast exam (CBE) by a health care provider every 1-3 years as part of a regular health exam.  If you are 28 or older, have a CBE every year. Also consider having a breast X-ray (mammogram) every year.  If you have a family  history of breast cancer, talk to your health care provider about genetic screening.  If you are at high risk for breast cancer, talk to your health care provider about having an MRI and a mammogram every year.  Breast cancer gene (BRCA) assessment is recommended for women who have family members with BRCA-related cancers. BRCA-related cancers include: ? Breast. ? Ovarian. ? Tubal. ? Peritoneal cancers.  Results of the assessment will determine the need for genetic counseling and BRCA1 and BRCA2 testing. Cervical Cancer Your health care  provider may recommend that you be screened regularly for cancer of the pelvic organs (ovaries, uterus, and vagina). This screening involves a pelvic examination, including checking for microscopic changes to the surface of your cervix (Pap test). You may be encouraged to have this screening done every 3 years, beginning at age 63.  For women ages 29-65, health care providers may recommend pelvic exams and Pap testing every 3 years, or they may recommend the Pap and pelvic exam, combined with testing for human papilloma virus (HPV), every 5 years. Some types of HPV increase your risk of cervical cancer. Testing for HPV may also be done on women of any age with unclear Pap test results.  Other health care providers may not recommend any screening for nonpregnant women who are considered low risk for pelvic cancer and who do not have symptoms. Ask your health care provider if a screening pelvic exam is right for you.  If you have had past treatment for cervical cancer or a condition that could lead to cancer, you need Pap tests and screening for cancer for at least 20 years after your treatment. If Pap tests have been discontinued, your risk factors (such as having a new sexual partner) need to be reassessed to determine if screening should resume. Some women have medical problems that increase the chance of getting cervical cancer. In these cases, your health care provider may recommend more frequent screening and Pap tests. Colorectal Cancer  This type of cancer can be detected and often prevented.  Routine colorectal cancer screening usually begins at 61 years of age and continues through 61 years of age.  Your health care provider may recommend screening at an earlier age if you have risk factors for colon cancer.  Your health care provider may also recommend using home test kits to check for hidden blood in the stool.  A small camera at the end of a tube can be used to examine your colon directly  (sigmoidoscopy or colonoscopy). This is done to check for the earliest forms of colorectal cancer.  Routine screening usually begins at age 69.  Direct examination of the colon should be repeated every 5-10 years through 61 years of age. However, you may need to be screened more often if early forms of precancerous polyps or small growths are found. Skin Cancer  Check your skin from head to toe regularly.  Tell your health care provider about any new moles or changes in moles, especially if there is a change in a mole's shape or color.  Also tell your health care provider if you have a mole that is larger than the size of a pencil eraser.  Always use sunscreen. Apply sunscreen liberally and repeatedly throughout the day.  Protect yourself by wearing long sleeves, pants, a wide-brimmed hat, and sunglasses whenever you are outside. Heart disease, diabetes, and high blood pressure  High blood pressure causes heart disease and increases the risk of stroke. High blood pressure is  more likely to develop in: ? People who have blood pressure in the high end of the normal range (130-139/85-89 mm Hg). ? People who are overweight or obese. ? People who are African American.  If you are 61-71 years of age, have your blood pressure checked every 3-5 years. If you are 51 years of age or older, have your blood pressure checked every year. You should have your blood pressure measured twice-once when you are at a hospital or clinic, and once when you are not at a hospital or clinic. Record the average of the two measurements. To check your blood pressure when you are not at a hospital or clinic, you can use: ? An automated blood pressure machine at a pharmacy. ? A home blood pressure monitor.  If you are between 80 years and 22 years old, ask your health care provider if you should take aspirin to prevent strokes.  Have regular diabetes screenings. This involves taking a blood sample to check your  fasting blood sugar level. ? If you are at a normal weight and have a low risk for diabetes, have this test once every three years after 61 years of age. ? If you are overweight and have a high risk for diabetes, consider being tested at a younger age or more often. Preventing infection Hepatitis B  If you have a higher risk for hepatitis B, you should be screened for this virus. You are considered at high risk for hepatitis B if: ? You were born in a country where hepatitis B is common. Ask your health care provider which countries are considered high risk. ? Your parents were born in a high-risk country, and you have not been immunized against hepatitis B (hepatitis B vaccine). ? You have HIV or AIDS. ? You use needles to inject street drugs. ? You live with someone who has hepatitis B. ? You have had sex with someone who has hepatitis B. ? You get hemodialysis treatment. ? You take certain medicines for conditions, including cancer, organ transplantation, and autoimmune conditions. Hepatitis C  Blood testing is recommended for: ? Everyone born from [birth year] through 1965. ? Anyone with known risk factors for hepatitis C. Sexually transmitted infections (STIs)  You should be screened for sexually transmitted infections (STIs) including gonorrhea and chlamydia if: ? You are sexually active and are younger than 61 years of age. ? You are older than 61 years of age and your health care provider tells you that you are at risk for this type of infection. ? Your sexual activity has changed since you were last screened and you are at an increased risk for chlamydia or gonorrhea. Ask your health care provider if you are at risk.  If you do not have HIV, but are at risk, it may be recommended that you take a prescription medicine daily to prevent HIV infection. This is called pre-exposure prophylaxis (PrEP). You are considered at risk if: ? You are sexually active and do not regularly use condoms or  know the HIV status of your partner(s). ? You take drugs by injection. ? You are sexually active with a partner who has HIV. Talk with your health care provider about whether you are at high risk of being infected with HIV. If you choose to begin PrEP, you should first be tested for HIV. You should then be tested every 3 months for as long as you are taking PrEP. Pregnancy  If you are premenopausal and you may become pregnant,  ask your health care provider about preconception counseling.  If you may become pregnant, take 400 to 800 micrograms (mcg) of folic acid every day.  If you want to prevent pregnancy, talk to your health care provider about birth control (contraception). Osteoporosis and menopause  Osteoporosis is a disease in which the bones lose minerals and strength with aging. This can result in serious bone fractures. Your risk for osteoporosis can be identified using a bone density scan.  If you are 65 years of age or older, or if you are at risk for osteoporosis and fractures, ask your health care provider if you should be screened.  Ask your health care provider whether you should take a calcium or vitamin D supplement to lower your risk for osteoporosis.  Menopause may have certain physical symptoms and risks.  Hormone replacement therapy may reduce some of these symptoms and risks. Talk to your health care provider about whether hormone replacement therapy is right for you. Follow these instructions at home:  Schedule regular health, dental, and eye exams.  Stay current with your immunizations.  Do not use any tobacco products including cigarettes, chewing tobacco, or electronic cigarettes.  If you are pregnant, do not drink alcohol.  If you are breastfeeding, limit how much and how often you drink alcohol.  Limit alcohol intake to no more than 1 drink per day for nonpregnant women. One drink equals 12 ounces of beer, 5 ounces of wine, or 1 ounces of hard  liquor.  Do not use street drugs.  Do not share needles.  Ask your health care provider for help if you need support or information about quitting drugs.  Tell your health care provider if you often feel depressed.  Tell your health care provider if you have ever been abused or do not feel safe at home. This information is not intended to replace advice given to you by your health care provider. Make sure you discuss any questions you have with your health care provider. Document Released: 07/23/2010 Document Revised: 06/15/2015 Document Reviewed: 10/11/2014 Elsevier Interactive Patient Education  2019 Elsevier Inc.  

## 2018-02-10 NOTE — Assessment & Plan Note (Signed)
Stable with albuterol prn and singulair. Uses symbicort only with flare or cold. Taking zyrtec and flonase also.

## 2018-02-10 NOTE — Assessment & Plan Note (Signed)
Ordered DEXA today.

## 2018-02-10 NOTE — Progress Notes (Signed)
   Subjective:   Patient ID: Gina Graves, female    DOB: November 19, 1955, 63 y.o.   MRN: [id number]  HPI The patient is a 63 YO female coming in for physical.   PMH, [facility], social history reviewed and updated.   Review of Systems  Constitutional: Negative.   HENT: Negative.   Eyes: Negative.   Respiratory: Negative for cough, chest tightness and shortness of breath.   Cardiovascular: Negative for chest pain, palpitations and leg swelling.  Gastrointestinal: Negative for abdominal distention, abdominal pain, constipation, diarrhea, nausea and vomiting.  Musculoskeletal: Negative.   Skin: Negative.   Neurological: Negative.   Psychiatric/Behavioral: Negative.     Objective:  Physical Exam Constitutional:      Appearance: She is well-developed.  HENT:     Head: Normocephalic and atraumatic.  Neck:     Musculoskeletal: Normal range of motion.  Cardiovascular:     Rate and Rhythm: Normal rate and regular rhythm.  Pulmonary:     Effort: Pulmonary effort is normal. No respiratory distress.     Breath sounds: Normal breath sounds. No wheezing or rales.  Abdominal:     General: Bowel sounds are normal. There is no distension.     Palpations: Abdomen is soft.     Tenderness: There is no abdominal tenderness. There is no rebound.  Skin:    General: Skin is warm and dry.  Neurological:     Mental Status: She is alert and oriented to person, place, and time.     Coordination: Coordination normal.     Vitals:   02/10/18 1505  BP: 108/70  Pulse: 79  Temp: 97.9 F (36.6 C)  TempSrc: Oral  SpO2: 99%  Weight: 170 lb (77.1 kg)  Height: 5\' 6"  (1.676 m)   EKG: Rate 67, sinus, axis normal, intervals normal, no st or t wave changes, no change when compared to 2016.  Assessment & Plan:

## 2018-02-10 NOTE — Assessment & Plan Note (Signed)
Flu shot up to date. Shingrix complete. Tetanus up to date. Cologuard ordered. Mammogram getting records but up to date, pap smear up to date and dexa ordered. Counseled about sun safety and mole surveillance. Counseled about the dangers of distracted driving. Given 10 year screening recommendations.

## 2018-02-15 DIAGNOSIS — Z1212 Encounter for screening for malignant neoplasm of rectum: Secondary | ICD-10-CM | POA: Diagnosis not present

## 2018-02-15 DIAGNOSIS — Z1211 Encounter for screening for malignant neoplasm of colon: Secondary | ICD-10-CM | POA: Diagnosis not present

## 2018-02-17 ENCOUNTER — Encounter: Payer: Self-pay | Admitting: Internal Medicine

## 2018-02-17 ENCOUNTER — Other Ambulatory Visit: Payer: Self-pay | Admitting: Internal Medicine

## 2018-02-17 MED FILL — MONTELUKAST SOD 10 MG TAB: 10 | 90 days supply | Qty: 90 | Fill #0

## 2018-02-17 NOTE — Progress Notes (Signed)
Abstracted and sent to scan  

## 2018-02-18 LAB — COLOGUARD

## 2018-02-19 ENCOUNTER — Encounter: Payer: Self-pay | Admitting: Internal Medicine

## 2018-02-19 MED FILL — DULoxetine HCL 60 MG CPEP: 60 | 90 days supply | Qty: 180 | Fill #0

## 2018-02-19 NOTE — Progress Notes (Unsigned)
Abstracted and sent to scan  

## 2018-03-09 DIAGNOSIS — J329 Chronic sinusitis, unspecified: Secondary | ICD-10-CM | POA: Diagnosis not present

## 2018-03-09 DIAGNOSIS — M9901 Segmental and somatic dysfunction of cervical region: Secondary | ICD-10-CM | POA: Diagnosis not present

## 2018-03-09 DIAGNOSIS — M9902 Segmental and somatic dysfunction of thoracic region: Secondary | ICD-10-CM | POA: Diagnosis not present

## 2018-03-13 DIAGNOSIS — M9902 Segmental and somatic dysfunction of thoracic region: Secondary | ICD-10-CM | POA: Diagnosis not present

## 2018-03-13 DIAGNOSIS — J329 Chronic sinusitis, unspecified: Secondary | ICD-10-CM | POA: Diagnosis not present

## 2018-03-13 DIAGNOSIS — M9901 Segmental and somatic dysfunction of cervical region: Secondary | ICD-10-CM | POA: Diagnosis not present

## 2018-03-27 MED FILL — EVAMIST 1.53 MG/SPRAY: 1.53 | 56 days supply | Qty: 8 | Fill #1

## 2018-03-31 DIAGNOSIS — F341 Dysthymic disorder: Secondary | ICD-10-CM | POA: Diagnosis not present

## 2018-04-02 MED FILL — traZODone HCL 50 MG TABS: 50 | 90 days supply | Qty: 180 | Fill #0

## 2018-04-18 ENCOUNTER — Encounter: Payer: Self-pay | Admitting: Internal Medicine

## 2018-04-18 DIAGNOSIS — J4541 Moderate persistent asthma with (acute) exacerbation: Secondary | ICD-10-CM

## 2018-04-20 MED ORDER — BUDESONIDE-FORMOTEROL FUMARATE 160-4.5 MCG/ACT IN AERO: 2.0000 | INHALATION_SPRAY | Freq: Two times a day (BID) | RESPIRATORY_TRACT | 3 refills | Status: DC

## 2018-04-20 MED ORDER — BENZONATATE 200 MG PO CAPS: 200.0000 mg | ORAL_CAPSULE | Freq: Three times a day (TID) | ORAL | 0 refills | Status: DC | PRN

## 2018-04-20 MED ORDER — ALBUTEROL SULFATE HFA 108 (90 BASE) MCG/ACT IN AERS: 2.0000 | INHALATION_SPRAY | RESPIRATORY_TRACT | 0 refills | Status: DC | PRN

## 2018-04-20 MED FILL — SYMBICORT 160-4.5 MCG INH: 160-4.5 | 30 days supply | Qty: 10 | Fill #0

## 2018-04-20 MED FILL — ALBUTEROL SULFATE HFA 108 (: 108 (90 BAS | 17 days supply | Qty: 9 | Fill #0

## 2018-04-20 MED FILL — BENZONATATE 200 MG CAP: 200 | 20 days supply | Qty: 60 | Fill #0

## 2018-05-14 MED FILL — DULOXETINE HCL 60 MG CPEP: 60 | 90 days supply | Qty: 180 | Fill #0

## 2018-05-14 MED FILL — MONTELUKAST SOD 10 MG TAB: 10 | 90 days supply | Qty: 90 | Fill #0

## 2018-05-15 MED FILL — ALPRAZolam 0.5 MG TABS: 0.5 | 90 days supply | Qty: 180 | Fill #0

## 2018-06-03 DIAGNOSIS — H52223 Regular astigmatism, bilateral: Secondary | ICD-10-CM | POA: Diagnosis not present

## 2018-06-03 DIAGNOSIS — H524 Presbyopia: Secondary | ICD-10-CM | POA: Diagnosis not present

## 2018-06-03 DIAGNOSIS — H5203 Hypermetropia, bilateral: Secondary | ICD-10-CM | POA: Diagnosis not present

## 2018-06-03 DIAGNOSIS — H43813 Vitreous degeneration, bilateral: Secondary | ICD-10-CM | POA: Diagnosis not present

## 2018-06-03 DIAGNOSIS — H2513 Age-related nuclear cataract, bilateral: Secondary | ICD-10-CM | POA: Diagnosis not present

## 2018-07-12 ENCOUNTER — Telehealth: Payer: 59 | Admitting: Physician Assistant

## 2018-07-12 DIAGNOSIS — H1013 Acute atopic conjunctivitis, bilateral: Secondary | ICD-10-CM | POA: Diagnosis not present

## 2018-07-12 MED ORDER — AZELASTINE HCL 0.05 % OP SOLN: 1.0000 [drp] | Freq: Two times a day (BID) | OPHTHALMIC | 0 refills | Status: DC

## 2018-07-12 NOTE — Progress Notes (Signed)
We are sorry that you are not feeling well.  Here is how we plan to help!  Based on what you have shared with me it looks like you have conjunctivitis.  Conjunctivitis is a common inflammatory or infectious condition of the eye that is often referred to as "pink eye".  In most cases it is contagious (viral or bacterial). However, not all conjunctivitis requires antibiotics (ex. Allergic).  We have made appropriate suggestions for you based upon your presentation.  I have prescribed Optiva eye drops to use as directed to help with symptoms. A prescription has been sent to the requested pharmacy.   Pink eye can be highly contagious.  It is typically spread through direct contact with secretions, or contaminated objects or surfaces that one may have touched.  Strict handwashing is suggested with soap and water is urged.  If not available, use alcohol based had sanitizer.  Avoid unnecessary touching of the eye.  If you wear contact lenses, you will need to refrain from wearing them until you see no white discharge from the eye for at least 24 hours after being on medication.  You should see symptom improvement in 1-2 days after starting the medication regimen.  Call us if symptoms are not improved in 1-2 days.  Home Care:  Wash your hands often!  Do not wear your contacts until you complete your treatment plan.  Avoid sharing towels, bed linen, personal items with a person who has pink eye.  See attention for anyone in your home with similar symptoms.  Get Help Right Away If:  Your symptoms do not improve.  You develop blurred or loss of vision.  Your symptoms worsen (increased discharge, pain or redness)  Your e-visit answers were reviewed by a board certified advanced clinical practitioner to complete your personal care plan.  Depending on the condition, your plan could have included both over the counter or prescription medications.  If there is a problem please reply  once you have  received a response from your provider.  Your safety is important to [place].  If you have drug allergies check your prescription carefully.    You can use MyChart to ask questions about today's visit, request a non-urgent call back, or ask for a work or school excuse for 24 hours related to this e-Visit. If it has been greater than 24 hours you will need to follow up with your provider, or enter a new e-Visit to address those concerns.   You will get an e-mail in the next two days asking about your experience.  I hope that your e-visit has been valuable and will speed your recovery. Thank you for using e-visits.

## 2018-07-12 NOTE — Progress Notes (Signed)
I have spent 5 minutes in review of e-visit questionnaire, review and updating patient chart, medical decision making and response to patient.   Charlissa Petros Cody Oreoluwa Gilmer, PA-C    

## 2018-07-20 MED FILL — EVAMIST 1.53 MG/SPRAY: 1.53 | 56 days supply | Qty: 8 | Fill #2

## 2018-07-29 DIAGNOSIS — J329 Chronic sinusitis, unspecified: Secondary | ICD-10-CM | POA: Diagnosis not present

## 2018-08-06 ENCOUNTER — Other Ambulatory Visit: Payer: Self-pay | Admitting: Internal Medicine

## 2018-08-06 MED FILL — DULOXETINE HCL 60 MG CPEP: 60 | 90 days supply | Qty: 180 | Fill #1

## 2018-08-06 MED FILL — MONTELUKAST SOD 10 MG TAB: 10 | 90 days supply | Qty: 90 | Fill #0

## 2018-08-11 MED FILL — ALPRAZolam 0.5 MG TABS: 0.5 | 90 days supply | Qty: 180 | Fill #1

## 2018-08-17 ENCOUNTER — Encounter: Payer: Self-pay | Admitting: Internal Medicine

## 2018-09-11 DIAGNOSIS — Z1231 Encounter for screening mammogram for malignant neoplasm of breast: Secondary | ICD-10-CM | POA: Diagnosis not present

## 2018-09-14 MED FILL — traZODone HCL 50 MG TABS: 50 | 90 days supply | Qty: 180 | Fill #1

## 2018-10-14 MED FILL — EVAMIST 1.53 MG/SPRAY: 1.53 | 56 days supply | Qty: 8 | Fill #0

## 2018-10-20 DIAGNOSIS — Z01419 Encounter for gynecological examination (general) (routine) without abnormal findings: Secondary | ICD-10-CM | POA: Diagnosis not present

## 2018-10-20 DIAGNOSIS — Z6828 Body mass index (BMI) 28.0-28.9, adult: Secondary | ICD-10-CM | POA: Diagnosis not present

## 2018-11-09 ENCOUNTER — Encounter: Payer: Self-pay | Admitting: Internal Medicine

## 2018-11-10 MED FILL — ALPRAZolam 0.5 MG TABS: 0.5 | 90 days supply | Qty: 180 | Fill #0

## 2018-11-18 MED FILL — DULoxetine HCL 60 MG CPEP: 60 | 90 days supply | Qty: 180 | Fill #0

## 2018-11-18 MED FILL — MONTELUKAST SOD 10 MG TAB: 10 | 90 days supply | Qty: 90 | Fill #0

## 2018-11-25 MED FILL — traZODone HCL 50 MG TABS: 50 | 90 days supply | Qty: 180 | Fill #0

## 2018-12-14 MED FILL — EVAMIST 1.53 MG/SPRAY: 1.53 | 56 days supply | Qty: 8 | Fill #0

## 2018-12-28 ENCOUNTER — Ambulatory Visit: Payer: 59 | Admitting: Internal Medicine

## 2019-02-15 ENCOUNTER — Encounter: Payer: Self-pay | Admitting: Internal Medicine

## 2019-02-15 ENCOUNTER — Ambulatory Visit (INDEPENDENT_AMBULATORY_CARE_PROVIDER_SITE_OTHER): Payer: 59 | Admitting: Internal Medicine

## 2019-02-15 ENCOUNTER — Other Ambulatory Visit: Payer: Self-pay

## 2019-02-15 VITALS — BP 124/80 | HR 71 | Temp 98.2°F | Ht 66.0 in | Wt 173.0 lb

## 2019-02-15 DIAGNOSIS — F3342 Major depressive disorder, recurrent, in full remission: Secondary | ICD-10-CM

## 2019-02-15 DIAGNOSIS — M8580 Other specified disorders of bone density and structure, unspecified site: Secondary | ICD-10-CM

## 2019-02-15 DIAGNOSIS — J4520 Mild intermittent asthma, uncomplicated: Secondary | ICD-10-CM

## 2019-02-15 DIAGNOSIS — Z0000 Encounter for general adult medical examination without abnormal findings: Secondary | ICD-10-CM

## 2019-02-15 LAB — COMPREHENSIVE METABOLIC PANEL
ALT: 9 U/L (ref 0–35)
AST: 13 U/L (ref 0–37)
Albumin: 4.4 g/dL (ref 3.5–5.2)
Alkaline Phosphatase: 42 U/L (ref 39–117)
BUN: 16 mg/dL (ref 6–23)
CO2: 26 mEq/L (ref 19–32)
Calcium: 9 mg/dL (ref 8.4–10.5)
Chloride: 107 mEq/L (ref 96–112)
Creatinine, Ser: 0.79 mg/dL (ref 0.40–1.20)
GFR: 73.79 mL/min (ref >60.00)
Glucose, Bld: 91 mg/dL (ref 70–99)
Potassium: 3.9 mEq/L (ref 3.5–5.1)
Sodium: 140 mEq/L (ref 135–145)
Total Bilirubin: 0.5 mg/dL (ref 0.2–1.2)
Total Protein: 6.8 g/dL (ref 6.0–8.3)

## 2019-02-15 LAB — CBC
HCT: 41.9 % (ref 36.0–46.0)
Hemoglobin: 14.1 g/dL (ref 12.0–15.0)
MCHC: 33.6 g/dL (ref 30.0–36.0)
MCV: 93.7 fl (ref 78.0–100.0)
Platelets: 303 10*3/uL (ref 150.0–400.0)
RBC: 4.48 Mil/uL (ref 3.87–5.11)
RDW: 13.3 % (ref 11.5–15.5)
WBC: 4.6 10*3/uL (ref 4.0–10.5)

## 2019-02-15 LAB — LIPID PANEL
Cholesterol: 173 mg/dL (ref 0–200)
HDL: 54.2 mg/dL (ref >39.00)
LDL Cholesterol: 100 mg/dL — ABNORMAL HIGH (ref 0–99)
NonHDL: 118.81
Total CHOL/HDL Ratio: 3
Triglycerides: 92 mg/dL (ref 0.0–149.0)
VLDL: 18.4 mg/dL (ref 0.0–40.0)

## 2019-02-15 LAB — TSH: TSH: 1.37 u[IU]/mL (ref 0.35–4.50)

## 2019-02-15 LAB — HEMOGLOBIN A1C: Hgb A1c MFr Bld: 5.7 % (ref 4.6–6.5)

## 2019-02-15 MED ORDER — NYSTATIN-TRIAMCINOLONE 100000-0.1 UNIT/GM-% EX OINT: 1.0000 "application " | TOPICAL_OINTMENT | Freq: Two times a day (BID) | CUTANEOUS | 3 refills | Status: DC

## 2019-02-15 MED FILL — NYSTATIN-TRIAMCINOLONE OINT: 100000-0.1 | 15 days supply | Qty: 30 | Fill #0

## 2019-02-15 NOTE — Patient Instructions (Signed)
Health Maintenance, Female Adopting a healthy lifestyle and getting preventive care are important in promoting health and wellness. Ask your health care provider about:  The right schedule for you to have regular tests and exams.  Things you can do on your own to prevent diseases and keep yourself healthy. What should I know about diet, weight, and exercise? Eat a healthy diet   Eat a diet that includes plenty of vegetables, fruits, low-fat dairy products, and lean protein.  Do not eat a lot of foods that are high in solid fats, added sugars, or sodium. Maintain a healthy weight Body mass index (BMI) is used to identify weight problems. It estimates body fat based on height and weight. Your health care provider can help determine your BMI and help you achieve or maintain a healthy weight. Get regular exercise Get regular exercise. This is one of the most important things you can do for your health. Most adults should:  Exercise for at least 150 minutes each week. The exercise should increase your heart rate and make you sweat (moderate-intensity exercise).  Do strengthening exercises at least twice a week. This is in addition to the moderate-intensity exercise.  Spend less time sitting. Even light physical activity can be beneficial. Watch cholesterol and blood lipids Have your blood tested for lipids and cholesterol at 62 years of age, then have this test every 5 years. Have your cholesterol levels checked more often if:  Your lipid or cholesterol levels are high.  You are older than 62 years of age.  You are at high risk for heart disease. What should I know about cancer screening? Depending on your health history and family history, you may need to have cancer screening at various ages. This may include screening for:  Breast cancer.  Cervical cancer.  Colorectal cancer.  Skin cancer.  Lung cancer. What should I know about heart disease, diabetes, and high blood  pressure? Blood pressure and heart disease  High blood pressure causes heart disease and increases the risk of stroke. This is more likely to develop in people who have high blood pressure readings, are of African descent, or are overweight.  Have your blood pressure checked: ? Every 3-5 years if you are 18-39 years of age. ? Every year if you are 40 years old or older. Diabetes Have regular diabetes screenings. This checks your fasting blood sugar level. Have the screening done:  Once every three years after age 40 if you are at a normal weight and have a low risk for diabetes.  More often and at a younger age if you are overweight or have a high risk for diabetes. What should I know about preventing infection? Hepatitis B If you have a higher risk for hepatitis B, you should be screened for this virus. Talk with your health care provider to find out if you are at risk for hepatitis B infection. Hepatitis C Testing is recommended for:  Everyone born from 1945 through 1965.  Anyone with known risk factors for hepatitis C. Sexually transmitted infections (STIs)  Get screened for STIs, including gonorrhea and chlamydia, if: ? You are sexually active and are younger than 62 years of age. ? You are older than 62 years of age and your health care provider tells you that you are at risk for this type of infection. ? Your sexual activity has changed since you were last screened, and you are at increased risk for chlamydia or gonorrhea. Ask your health care provider if   you are at risk.  Ask your health care provider about whether you are at high risk for HIV. Your health care provider may recommend a prescription medicine to help prevent HIV infection. If you choose to take medicine to prevent HIV, you should first get tested for HIV. You should then be tested every 3 months for as long as you are taking the medicine. Pregnancy  If you are about to stop having your period (premenopausal) and  you may become pregnant, seek counseling before you get pregnant.  Take 400 to 800 micrograms (mcg) of folic acid every day if you become pregnant.  Ask for birth control (contraception) if you want to prevent pregnancy. Osteoporosis and menopause Osteoporosis is a disease in which the bones lose minerals and strength with aging. This can result in bone fractures. If you are 65 years old or older, or if you are at risk for osteoporosis and fractures, ask your health care provider if you should:  Be screened for bone loss.  Take a calcium or vitamin D supplement to lower your risk of fractures.  Be given hormone replacement therapy (HRT) to treat symptoms of menopause. Follow these instructions at home: Lifestyle  Do not use any products that contain nicotine or tobacco, such as cigarettes, e-cigarettes, and chewing tobacco. If you need help quitting, ask your health care provider.  Do not use street drugs.  Do not share needles.  Ask your health care provider for help if you need support or information about quitting drugs. Alcohol use  Do not drink alcohol if: ? Your health care provider tells you not to drink. ? You are pregnant, may be pregnant, or are planning to become pregnant.  If you drink alcohol: ? Limit how much you use to 0-1 drink a day. ? Limit intake if you are breastfeeding.  Be aware of how much alcohol is in your drink. In the [place] one drink equals one 12 oz bottle of beer (355 mL), one 5 oz glass of wine (148 mL), or one 1 oz glass of hard liquor (44 mL). General instructions  Schedule regular health, dental, and eye exams.  Stay current with your vaccines.  Tell your health care provider if: ? You often feel depressed. ? You have ever been abused or do not feel safe at home. Summary  Adopting a healthy lifestyle and getting preventive care are important in promoting health and wellness.  Follow your health care provider's instructions about healthy  diet, exercising, and getting tested or screened for diseases.  Follow your health care provider's instructions on monitoring your cholesterol and blood pressure. This information is not intended to replace advice given to you by your health care provider. Make sure you discuss any questions you have with your health care provider. Document Revised: 12/31/2017 Document Reviewed: 12/31/2017 Elsevier Patient Education  2020 Elsevier Inc.  

## 2019-02-15 NOTE — Assessment & Plan Note (Signed)
Flare with covid-19 last March. Is back to using albuterol prn only. Has symbicort to take prn also.

## 2019-02-15 NOTE — Assessment & Plan Note (Signed)
Flu shot up to date. Covid-19 first dose received. Shingrix complete. Tetanus up to date. Cologuard up to date. Mammogram up to date, pap smear up to date and dexa up to date. Counseled about sun safety and mole surveillance. Counseled about the dangers of distracted driving. Given 10 year screening recommendations.

## 2019-02-15 NOTE — Progress Notes (Signed)
   Subjective:   Patient ID: Gina Graves, female    DOB: Nov 02, 1955, 64 y.o.   MRN: [id number]  HPI The patient is a 64 YO female coming in for physical.   PMH, [place], social history reviewed and updated  Review of Systems  Constitutional: Negative.   HENT: Negative.   Eyes: Negative.   Respiratory: Negative for cough, chest tightness and shortness of breath.   Cardiovascular: Negative for chest pain, palpitations and leg swelling.  Gastrointestinal: Negative for abdominal distention, abdominal pain, constipation, diarrhea, nausea and vomiting.  Musculoskeletal: Negative.   Skin: Negative.   Neurological: Negative.   Psychiatric/Behavioral: Negative.     Objective:  Physical Exam Constitutional:      Appearance: She is well-developed.  HENT:     Head: Normocephalic and atraumatic.  Cardiovascular:     Rate and Rhythm: Normal rate and regular rhythm.  Pulmonary:     Effort: Pulmonary effort is normal. No respiratory distress.     Breath sounds: Normal breath sounds. No wheezing or rales.  Abdominal:     General: Bowel sounds are normal. There is no distension.     Palpations: Abdomen is soft.     Tenderness: There is no abdominal tenderness. There is no rebound.  Musculoskeletal:        General: No tenderness.     Cervical back: Normal range of motion.  Skin:    General: Skin is warm and dry.  Neurological:     Mental Status: She is alert and oriented to person, place, and time.     Coordination: Coordination normal.     Vitals:   02/15/19 0759  BP: 124/80  Pulse: 71  Temp: 98.2 F (36.8 C)  TempSrc: Oral  SpO2: 96%  Weight: 173 lb (78.5 kg)  Height: 5\' 6"  (1.676 m)    This visit occurred during the SARS-CoV-2 public health emergency.  Safety protocols were in place, including screening questions prior to the visit, additional usage of staff PPE, and extensive cleaning of exam room while observing appropriate contact time as indicated for disinfecting  solutions.   Assessment & Plan:

## 2019-02-15 NOTE — Assessment & Plan Note (Signed)
Stable on cymbalta and trazodone.

## 2019-02-15 NOTE — Assessment & Plan Note (Signed)
Needs repeat DEXA 2022 or 2023.

## 2019-02-17 ENCOUNTER — Encounter (INDEPENDENT_AMBULATORY_CARE_PROVIDER_SITE_OTHER): Payer: Self-pay

## 2019-02-18 ENCOUNTER — Other Ambulatory Visit: Payer: Self-pay | Admitting: Internal Medicine

## 2019-02-18 MED FILL — DULoxetine HCL 60 MG CPEP: 60 | 90 days supply | Qty: 180 | Fill #1

## 2019-02-19 MED FILL — MONTELUKAST SOD 10 MG TAB: 10 | 90 days supply | Qty: 90 | Fill #0

## 2019-03-01 ENCOUNTER — Other Ambulatory Visit: Payer: Self-pay | Admitting: Internal Medicine

## 2019-03-01 MED FILL — OMEPRAZOLE DR 20 MG CAPSULE: 20 | 90 days supply | Qty: 180 | Fill #0

## 2019-03-09 ENCOUNTER — Other Ambulatory Visit (HOSPITAL_COMMUNITY): Payer: Self-pay | Admitting: Psychiatry

## 2019-03-09 DIAGNOSIS — F341 Dysthymic disorder: Secondary | ICD-10-CM | POA: Diagnosis not present

## 2019-03-10 MED FILL — ALPRAZolam 0.5 MG TABS: 0.5 | 90 days supply | Qty: 180 | Fill #0

## 2019-03-10 MED FILL — traZODone HCL 50 MG TABS: 50 | 90 days supply | Qty: 180 | Fill #0

## 2019-03-15 DIAGNOSIS — M9905 Segmental and somatic dysfunction of pelvic region: Secondary | ICD-10-CM | POA: Diagnosis not present

## 2019-03-15 DIAGNOSIS — M9903 Segmental and somatic dysfunction of lumbar region: Secondary | ICD-10-CM | POA: Diagnosis not present

## 2019-03-15 DIAGNOSIS — M9901 Segmental and somatic dysfunction of cervical region: Secondary | ICD-10-CM | POA: Diagnosis not present

## 2019-03-15 DIAGNOSIS — M9902 Segmental and somatic dysfunction of thoracic region: Secondary | ICD-10-CM | POA: Diagnosis not present

## 2019-03-18 DIAGNOSIS — M9901 Segmental and somatic dysfunction of cervical region: Secondary | ICD-10-CM | POA: Diagnosis not present

## 2019-03-18 DIAGNOSIS — M9905 Segmental and somatic dysfunction of pelvic region: Secondary | ICD-10-CM | POA: Diagnosis not present

## 2019-03-18 DIAGNOSIS — M9903 Segmental and somatic dysfunction of lumbar region: Secondary | ICD-10-CM | POA: Diagnosis not present

## 2019-03-18 DIAGNOSIS — M9902 Segmental and somatic dysfunction of thoracic region: Secondary | ICD-10-CM | POA: Diagnosis not present

## 2019-03-22 DIAGNOSIS — M9901 Segmental and somatic dysfunction of cervical region: Secondary | ICD-10-CM | POA: Diagnosis not present

## 2019-03-22 DIAGNOSIS — M9902 Segmental and somatic dysfunction of thoracic region: Secondary | ICD-10-CM | POA: Diagnosis not present

## 2019-03-22 DIAGNOSIS — M9903 Segmental and somatic dysfunction of lumbar region: Secondary | ICD-10-CM | POA: Diagnosis not present

## 2019-03-22 DIAGNOSIS — M9905 Segmental and somatic dysfunction of pelvic region: Secondary | ICD-10-CM | POA: Diagnosis not present

## 2019-03-25 DIAGNOSIS — M9905 Segmental and somatic dysfunction of pelvic region: Secondary | ICD-10-CM | POA: Diagnosis not present

## 2019-03-25 DIAGNOSIS — M9901 Segmental and somatic dysfunction of cervical region: Secondary | ICD-10-CM | POA: Diagnosis not present

## 2019-03-25 DIAGNOSIS — M9902 Segmental and somatic dysfunction of thoracic region: Secondary | ICD-10-CM | POA: Diagnosis not present

## 2019-03-25 DIAGNOSIS — M9903 Segmental and somatic dysfunction of lumbar region: Secondary | ICD-10-CM | POA: Diagnosis not present

## 2019-03-29 DIAGNOSIS — M9903 Segmental and somatic dysfunction of lumbar region: Secondary | ICD-10-CM | POA: Diagnosis not present

## 2019-03-29 DIAGNOSIS — M9905 Segmental and somatic dysfunction of pelvic region: Secondary | ICD-10-CM | POA: Diagnosis not present

## 2019-03-29 DIAGNOSIS — M9902 Segmental and somatic dysfunction of thoracic region: Secondary | ICD-10-CM | POA: Diagnosis not present

## 2019-03-29 DIAGNOSIS — M9901 Segmental and somatic dysfunction of cervical region: Secondary | ICD-10-CM | POA: Diagnosis not present

## 2019-04-15 MED FILL — EVAMIST 1.53 MG/SPRAY: 1.53 | 56 days supply | Qty: 8 | Fill #1

## 2019-04-28 DIAGNOSIS — M9903 Segmental and somatic dysfunction of lumbar region: Secondary | ICD-10-CM | POA: Diagnosis not present

## 2019-04-28 DIAGNOSIS — M9905 Segmental and somatic dysfunction of pelvic region: Secondary | ICD-10-CM | POA: Diagnosis not present

## 2019-04-28 DIAGNOSIS — M9902 Segmental and somatic dysfunction of thoracic region: Secondary | ICD-10-CM | POA: Diagnosis not present

## 2019-04-28 DIAGNOSIS — M9901 Segmental and somatic dysfunction of cervical region: Secondary | ICD-10-CM | POA: Diagnosis not present

## 2019-04-28 DIAGNOSIS — J329 Chronic sinusitis, unspecified: Secondary | ICD-10-CM | POA: Diagnosis not present

## 2019-05-23 ENCOUNTER — Encounter: Payer: Self-pay | Admitting: Internal Medicine

## 2019-05-24 MED FILL — DULoxetine HCL 60 MG CPEP: 60 | 90 days supply | Qty: 180 | Fill #0

## 2019-05-24 MED FILL — MONTELUKAST SOD 10 MG TAB: 10 | 90 days supply | Qty: 90 | Fill #1

## 2019-05-26 DIAGNOSIS — M9901 Segmental and somatic dysfunction of cervical region: Secondary | ICD-10-CM | POA: Diagnosis not present

## 2019-05-26 DIAGNOSIS — M9905 Segmental and somatic dysfunction of pelvic region: Secondary | ICD-10-CM | POA: Diagnosis not present

## 2019-05-26 DIAGNOSIS — M9903 Segmental and somatic dysfunction of lumbar region: Secondary | ICD-10-CM | POA: Diagnosis not present

## 2019-05-26 DIAGNOSIS — M9902 Segmental and somatic dysfunction of thoracic region: Secondary | ICD-10-CM | POA: Diagnosis not present

## 2019-05-26 DIAGNOSIS — J329 Chronic sinusitis, unspecified: Secondary | ICD-10-CM | POA: Diagnosis not present

## 2019-06-24 DIAGNOSIS — M9901 Segmental and somatic dysfunction of cervical region: Secondary | ICD-10-CM | POA: Diagnosis not present

## 2019-06-24 DIAGNOSIS — M9902 Segmental and somatic dysfunction of thoracic region: Secondary | ICD-10-CM | POA: Diagnosis not present

## 2019-06-24 DIAGNOSIS — M9905 Segmental and somatic dysfunction of pelvic region: Secondary | ICD-10-CM | POA: Diagnosis not present

## 2019-06-24 DIAGNOSIS — M9903 Segmental and somatic dysfunction of lumbar region: Secondary | ICD-10-CM | POA: Diagnosis not present

## 2019-06-24 DIAGNOSIS — J329 Chronic sinusitis, unspecified: Secondary | ICD-10-CM | POA: Diagnosis not present

## 2019-06-25 MED FILL — ALPRAZolam 0.5 MG TABS: 0.5 | 90 days supply | Qty: 180 | Fill #1

## 2019-07-05 MED FILL — traZODone HCL 50 MG TABS: 50 | 90 days supply | Qty: 180 | Fill #1

## 2019-07-29 DIAGNOSIS — J329 Chronic sinusitis, unspecified: Secondary | ICD-10-CM | POA: Diagnosis not present

## 2019-07-29 DIAGNOSIS — M9901 Segmental and somatic dysfunction of cervical region: Secondary | ICD-10-CM | POA: Diagnosis not present

## 2019-07-29 DIAGNOSIS — M9902 Segmental and somatic dysfunction of thoracic region: Secondary | ICD-10-CM | POA: Diagnosis not present

## 2019-07-29 DIAGNOSIS — M9903 Segmental and somatic dysfunction of lumbar region: Secondary | ICD-10-CM | POA: Diagnosis not present

## 2019-07-29 DIAGNOSIS — M9905 Segmental and somatic dysfunction of pelvic region: Secondary | ICD-10-CM | POA: Diagnosis not present

## 2019-08-07 ENCOUNTER — Encounter: Payer: Self-pay | Admitting: Internal Medicine

## 2019-08-07 DIAGNOSIS — J4541 Moderate persistent asthma with (acute) exacerbation: Secondary | ICD-10-CM

## 2019-08-09 MED ORDER — BUDESONIDE-FORMOTEROL FUMARATE 160-4.5 MCG/ACT IN AERO: 2.0000 | INHALATION_SPRAY | Freq: Two times a day (BID) | RESPIRATORY_TRACT | 3 refills | Status: DC

## 2019-08-09 MED FILL — SYMBICORT 160-4.5 MCG INH: 160-4.5 | 30 days supply | Qty: 10 | Fill #0

## 2019-08-10 DIAGNOSIS — M9905 Segmental and somatic dysfunction of pelvic region: Secondary | ICD-10-CM | POA: Diagnosis not present

## 2019-08-10 DIAGNOSIS — M9901 Segmental and somatic dysfunction of cervical region: Secondary | ICD-10-CM | POA: Diagnosis not present

## 2019-08-10 DIAGNOSIS — J329 Chronic sinusitis, unspecified: Secondary | ICD-10-CM | POA: Diagnosis not present

## 2019-08-10 DIAGNOSIS — M9903 Segmental and somatic dysfunction of lumbar region: Secondary | ICD-10-CM | POA: Diagnosis not present

## 2019-08-10 DIAGNOSIS — M9902 Segmental and somatic dysfunction of thoracic region: Secondary | ICD-10-CM | POA: Diagnosis not present

## 2019-08-23 MED FILL — DULoxetine HCL 60 MG CPEP: 60 | 90 days supply | Qty: 180 | Fill #1

## 2019-08-23 MED FILL — MONTELUKAST SOD 10 MG TAB: 10 | 90 days supply | Qty: 90 | Fill #2

## 2019-08-30 ENCOUNTER — Encounter: Payer: Self-pay | Admitting: Internal Medicine

## 2019-08-30 DIAGNOSIS — Z1283 Encounter for screening for malignant neoplasm of skin: Secondary | ICD-10-CM

## 2019-08-31 NOTE — Telephone Encounter (Signed)
Referral cancelled. 

## 2019-09-02 DIAGNOSIS — D485 Neoplasm of uncertain behavior of skin: Secondary | ICD-10-CM | POA: Diagnosis not present

## 2019-09-02 DIAGNOSIS — B079 Viral wart, unspecified: Secondary | ICD-10-CM | POA: Diagnosis not present

## 2019-09-07 DIAGNOSIS — M9905 Segmental and somatic dysfunction of pelvic region: Secondary | ICD-10-CM | POA: Diagnosis not present

## 2019-09-07 DIAGNOSIS — M9902 Segmental and somatic dysfunction of thoracic region: Secondary | ICD-10-CM | POA: Diagnosis not present

## 2019-09-07 DIAGNOSIS — M9901 Segmental and somatic dysfunction of cervical region: Secondary | ICD-10-CM | POA: Diagnosis not present

## 2019-09-07 DIAGNOSIS — J329 Chronic sinusitis, unspecified: Secondary | ICD-10-CM | POA: Diagnosis not present

## 2019-09-07 DIAGNOSIS — M9903 Segmental and somatic dysfunction of lumbar region: Secondary | ICD-10-CM | POA: Diagnosis not present

## 2019-09-30 DIAGNOSIS — M9902 Segmental and somatic dysfunction of thoracic region: Secondary | ICD-10-CM | POA: Diagnosis not present

## 2019-09-30 DIAGNOSIS — J329 Chronic sinusitis, unspecified: Secondary | ICD-10-CM | POA: Diagnosis not present

## 2019-09-30 DIAGNOSIS — M9903 Segmental and somatic dysfunction of lumbar region: Secondary | ICD-10-CM | POA: Diagnosis not present

## 2019-09-30 DIAGNOSIS — M9905 Segmental and somatic dysfunction of pelvic region: Secondary | ICD-10-CM | POA: Diagnosis not present

## 2019-09-30 DIAGNOSIS — M9901 Segmental and somatic dysfunction of cervical region: Secondary | ICD-10-CM | POA: Diagnosis not present

## 2019-10-01 ENCOUNTER — Other Ambulatory Visit (HOSPITAL_COMMUNITY): Payer: Self-pay | Admitting: Psychiatry

## 2019-10-01 MED FILL — ALPRAZolam 0.5 MG TABS: 0.5 | 90 days supply | Qty: 180 | Fill #0

## 2019-10-05 DIAGNOSIS — M9902 Segmental and somatic dysfunction of thoracic region: Secondary | ICD-10-CM | POA: Diagnosis not present

## 2019-10-05 DIAGNOSIS — M9905 Segmental and somatic dysfunction of pelvic region: Secondary | ICD-10-CM | POA: Diagnosis not present

## 2019-10-05 DIAGNOSIS — M9903 Segmental and somatic dysfunction of lumbar region: Secondary | ICD-10-CM | POA: Diagnosis not present

## 2019-10-05 DIAGNOSIS — M9901 Segmental and somatic dysfunction of cervical region: Secondary | ICD-10-CM | POA: Diagnosis not present

## 2019-10-05 DIAGNOSIS — J329 Chronic sinusitis, unspecified: Secondary | ICD-10-CM | POA: Diagnosis not present

## 2019-10-10 ENCOUNTER — Encounter: Payer: Self-pay | Admitting: Internal Medicine

## 2019-10-10 DIAGNOSIS — J4541 Moderate persistent asthma with (acute) exacerbation: Secondary | ICD-10-CM

## 2019-10-11 ENCOUNTER — Other Ambulatory Visit: Payer: Self-pay | Admitting: Internal Medicine

## 2019-10-11 MED ORDER — BUDESONIDE-FORMOTEROL FUMARATE 160-4.5 MCG/ACT IN AERO: 2.0000 | INHALATION_SPRAY | Freq: Two times a day (BID) | RESPIRATORY_TRACT | 1 refills | Status: DC

## 2019-10-11 MED ORDER — ALBUTEROL SULFATE HFA 108 (90 BASE) MCG/ACT IN AERS: 2.0000 | INHALATION_SPRAY | RESPIRATORY_TRACT | 1 refills | Status: DC | PRN

## 2019-10-11 MED FILL — ALBUTEROL SULFATE HFA 108 (: 108 (90 BAS | 16 days supply | Qty: 18 | Fill #0

## 2019-10-11 MED FILL — SYMBICORT 160-4.5 MCG INH: 160-4.5 | 30 days supply | Qty: 10 | Fill #0

## 2019-10-12 ENCOUNTER — Encounter: Payer: Self-pay | Admitting: Internal Medicine

## 2019-11-03 DIAGNOSIS — Z6828 Body mass index (BMI) 28.0-28.9, adult: Secondary | ICD-10-CM | POA: Diagnosis not present

## 2019-11-03 DIAGNOSIS — Z01419 Encounter for gynecological examination (general) (routine) without abnormal findings: Secondary | ICD-10-CM | POA: Diagnosis not present

## 2019-11-03 DIAGNOSIS — Z1231 Encounter for screening mammogram for malignant neoplasm of breast: Secondary | ICD-10-CM | POA: Diagnosis not present

## 2019-11-17 MED FILL — MONTELUKAST SOD 10 MG TAB: 10 | 90 days supply | Qty: 90 | Fill #3

## 2019-11-17 MED FILL — DULoxetine HCL 60 MG CPEP: 60 | 90 days supply | Qty: 180 | Fill #2

## 2019-12-27 DIAGNOSIS — J329 Chronic sinusitis, unspecified: Secondary | ICD-10-CM | POA: Diagnosis not present

## 2019-12-27 DIAGNOSIS — M9903 Segmental and somatic dysfunction of lumbar region: Secondary | ICD-10-CM | POA: Diagnosis not present

## 2019-12-27 DIAGNOSIS — M9902 Segmental and somatic dysfunction of thoracic region: Secondary | ICD-10-CM | POA: Diagnosis not present

## 2019-12-27 DIAGNOSIS — M9905 Segmental and somatic dysfunction of pelvic region: Secondary | ICD-10-CM | POA: Diagnosis not present

## 2019-12-27 DIAGNOSIS — M9901 Segmental and somatic dysfunction of cervical region: Secondary | ICD-10-CM | POA: Diagnosis not present

## 2020-01-02 DIAGNOSIS — Z20822 Contact with and (suspected) exposure to covid-19: Secondary | ICD-10-CM | POA: Diagnosis not present

## 2020-01-03 ENCOUNTER — Encounter: Payer: Self-pay | Admitting: Internal Medicine

## 2020-02-09 ENCOUNTER — Other Ambulatory Visit (HOSPITAL_COMMUNITY): Payer: Self-pay | Admitting: Obstetrics and Gynecology

## 2020-02-09 MED FILL — VALACYCLOVIR HCL 500 MG TAB: 500 | 30 days supply | Qty: 30 | Fill #0

## 2020-02-14 ENCOUNTER — Other Ambulatory Visit: Payer: Self-pay | Admitting: Internal Medicine

## 2020-02-14 MED FILL — SYMBICORT 160-4.5 MCG INH: 160-4.5 | 30 days supply | Qty: 10 | Fill #1

## 2020-02-14 MED FILL — traZODone HCL 50 MG TABS: 50 | 90 days supply | Qty: 180 | Fill #2

## 2020-02-14 MED FILL — DULoxetine HCL 60 MG CPEP: 60 | 90 days supply | Qty: 180 | Fill #3

## 2020-02-14 MED FILL — OMEPRAZOLE DR 20 MG CAPSULE: 20 | 90 days supply | Qty: 180 | Fill #1

## 2020-02-15 ENCOUNTER — Other Ambulatory Visit: Payer: Self-pay | Admitting: Internal Medicine

## 2020-02-15 MED FILL — ALPRAZolam 0.5 MG TABS: 0.5 | 90 days supply | Qty: 180 | Fill #0

## 2020-02-15 MED FILL — MONTELUKAST SOD 10 MG TAB: 10 | 90 days supply | Qty: 90 | Fill #0

## 2020-02-16 ENCOUNTER — Ambulatory Visit: Payer: 59 | Admitting: Internal Medicine

## 2020-02-17 ENCOUNTER — Encounter: Payer: Self-pay | Admitting: Internal Medicine

## 2020-02-17 ENCOUNTER — Ambulatory Visit (INDEPENDENT_AMBULATORY_CARE_PROVIDER_SITE_OTHER): Payer: 59 | Admitting: Internal Medicine

## 2020-02-17 ENCOUNTER — Other Ambulatory Visit: Payer: Self-pay

## 2020-02-17 ENCOUNTER — Other Ambulatory Visit: Payer: Self-pay | Admitting: Internal Medicine

## 2020-02-17 VITALS — BP 118/82 | HR 71 | Temp 98.4°F | Resp 18 | Ht 66.0 in | Wt 172.6 lb

## 2020-02-17 DIAGNOSIS — Z8249 Family history of ischemic heart disease and other diseases of the circulatory system: Secondary | ICD-10-CM | POA: Diagnosis not present

## 2020-02-17 DIAGNOSIS — F3342 Major depressive disorder, recurrent, in full remission: Secondary | ICD-10-CM | POA: Diagnosis not present

## 2020-02-17 DIAGNOSIS — Z0000 Encounter for general adult medical examination without abnormal findings: Secondary | ICD-10-CM

## 2020-02-17 DIAGNOSIS — J4520 Mild intermittent asthma, uncomplicated: Secondary | ICD-10-CM

## 2020-02-17 DIAGNOSIS — E782 Mixed hyperlipidemia: Secondary | ICD-10-CM | POA: Diagnosis not present

## 2020-02-17 LAB — LIPID PANEL
Cholesterol: 182 mg/dL (ref 0–200)
HDL: 58.8 mg/dL (ref >39.00)
LDL Cholesterol: 108 mg/dL — ABNORMAL HIGH (ref 0–99)
NonHDL: 123.12
Total CHOL/HDL Ratio: 3
Triglycerides: 74 mg/dL (ref 0.0–149.0)
VLDL: 14.8 mg/dL (ref 0.0–40.0)

## 2020-02-17 LAB — CBC
HCT: 41.8 % (ref 36.0–46.0)
Hemoglobin: 14.1 g/dL (ref 12.0–15.0)
MCHC: 33.8 g/dL (ref 30.0–36.0)
MCV: 93 fl (ref 78.0–100.0)
Platelets: 277 10*3/uL (ref 150.0–400.0)
RBC: 4.49 Mil/uL (ref 3.87–5.11)
RDW: 13.1 % (ref 11.5–15.5)
WBC: 4.8 10*3/uL (ref 4.0–10.5)

## 2020-02-17 LAB — COMPREHENSIVE METABOLIC PANEL
ALT: 10 U/L (ref 0–35)
AST: 13 U/L (ref 0–37)
Albumin: 4.4 g/dL (ref 3.5–5.2)
Alkaline Phosphatase: 40 U/L (ref 39–117)
BUN: 10 mg/dL (ref 6–23)
CO2: 31 mEq/L (ref 19–32)
Calcium: 9.7 mg/dL (ref 8.4–10.5)
Chloride: 105 mEq/L (ref 96–112)
Creatinine, Ser: 0.86 mg/dL (ref 0.40–1.20)
GFR: 72.2 mL/min (ref >60.00)
Glucose, Bld: 99 mg/dL (ref 70–99)
Potassium: 4.5 mEq/L (ref 3.5–5.1)
Sodium: 141 mEq/L (ref 135–145)
Total Bilirubin: 0.7 mg/dL (ref 0.2–1.2)
Total Protein: 6.9 g/dL (ref 6.0–8.3)

## 2020-02-17 LAB — TSH: TSH: 1.2 u[IU]/mL (ref 0.35–4.50)

## 2020-02-17 MED ORDER — IBUPROFEN 800 MG PO TABS: 800.0000 mg | ORAL_TABLET | Freq: Three times a day (TID) | ORAL | 1 refills | Status: DC | PRN

## 2020-02-17 MED FILL — IBUPROFEN 800 MG TABS: 800 | 30 days supply | Qty: 90 | Fill #0

## 2020-02-17 NOTE — Patient Instructions (Signed)
Health Maintenance, Female Adopting a healthy lifestyle and getting preventive care are important in promoting health and wellness. Ask your health care provider about:  The right schedule for you to have regular tests and exams.  Things you can do on your own to prevent diseases and keep yourself healthy. What should I know about diet, weight, and exercise? Eat a healthy diet  Eat a diet that includes plenty of vegetables, fruits, low-fat dairy products, and lean protein.  Do not eat a lot of foods that are high in solid fats, added sugars, or sodium.   Maintain a healthy weight Body mass index (BMI) is used to identify weight problems. It estimates body fat based on height and weight. Your health care provider can help determine your BMI and help you achieve or maintain a healthy weight. Get regular exercise Get regular exercise. This is one of the most important things you can do for your health. Most adults should:  Exercise for at least 150 minutes each week. The exercise should increase your heart rate and make you sweat (moderate-intensity exercise).  Do strengthening exercises at least twice a week. This is in addition to the moderate-intensity exercise.  Spend less time sitting. Even light physical activity can be beneficial. Watch cholesterol and blood lipids Have your blood tested for lipids and cholesterol at 63 years of age, then have this test every 5 years. Have your cholesterol levels checked more often if:  Your lipid or cholesterol levels are high.  You are older than 63 years of age.  You are at high risk for heart disease. What should I know about cancer screening? Depending on your health history and family history, you may need to have cancer screening at various ages. This may include screening for:  Breast cancer.  Cervical cancer.  Colorectal cancer.  Skin cancer.  Lung cancer. What should I know about heart disease, diabetes, and high blood  pressure? Blood pressure and heart disease  High blood pressure causes heart disease and increases the risk of stroke. This is more likely to develop in people who have high blood pressure readings, are of African descent, or are overweight.  Have your blood pressure checked: ? Every 3-5 years if you are 18-39 years of age. ? Every year if you are 40 years old or older. Diabetes Have regular diabetes screenings. This checks your fasting blood sugar level. Have the screening done:  Once every three years after age 40 if you are at a normal weight and have a low risk for diabetes.  More often and at a younger age if you are overweight or have a high risk for diabetes. What should I know about preventing infection? Hepatitis B If you have a higher risk for hepatitis B, you should be screened for this virus. Talk with your health care provider to find out if you are at risk for hepatitis B infection. Hepatitis C Testing is recommended for:  Everyone born from 1945 through 1965.  Anyone with known risk factors for hepatitis C. Sexually transmitted infections (STIs)  Get screened for STIs, including gonorrhea and chlamydia, if: ? You are sexually active and are younger than 63 years of age. ? You are older than 63 years of age and your health care provider tells you that you are at risk for this type of infection. ? Your sexual activity has changed since you were last screened, and you are at increased risk for chlamydia or gonorrhea. Ask your health care provider   if you are at risk.  Ask your health care provider about whether you are at high risk for HIV. Your health care provider may recommend a prescription medicine to help prevent HIV infection. If you choose to take medicine to prevent HIV, you should first get tested for HIV. You should then be tested every 3 months for as long as you are taking the medicine. Pregnancy  If you are about to stop having your period (premenopausal) and  you may become pregnant, seek counseling before you get pregnant.  Take 400 to 800 micrograms (mcg) of folic acid every day if you become pregnant.  Ask for birth control (contraception) if you want to prevent pregnancy. Osteoporosis and menopause Osteoporosis is a disease in which the bones lose minerals and strength with aging. This can result in bone fractures. If you are 65 years old or older, or if you are at risk for osteoporosis and fractures, ask your health care provider if you should:  Be screened for bone loss.  Take a calcium or vitamin D supplement to lower your risk of fractures.  Be given hormone replacement therapy (HRT) to treat symptoms of menopause. Follow these instructions at home: Lifestyle  Do not use any products that contain nicotine or tobacco, such as cigarettes, e-cigarettes, and chewing tobacco. If you need help quitting, ask your health care provider.  Do not use street drugs.  Do not share needles.  Ask your health care provider for help if you need support or information about quitting drugs. Alcohol use  Do not drink alcohol if: ? Your health care provider tells you not to drink. ? You are pregnant, may be pregnant, or are planning to become pregnant.  If you drink alcohol: ? Limit how much you use to 0-1 drink a day. ? Limit intake if you are breastfeeding.  Be aware of how much alcohol is in your drink. In the [place] one drink equals one 12 oz bottle of beer (355 mL), one 5 oz glass of wine (148 mL), or one 1 oz glass of hard liquor (44 mL). General instructions  Schedule regular health, dental, and eye exams.  Stay current with your vaccines.  Tell your health care provider if: ? You often feel depressed. ? You have ever been abused or do not feel safe at home. Summary  Adopting a healthy lifestyle and getting preventive care are important in promoting health and wellness.  Follow your health care provider's instructions about healthy  diet, exercising, and getting tested or screened for diseases.  Follow your health care provider's instructions on monitoring your cholesterol and blood pressure. This information is not intended to replace advice given to you by your health care provider. Make sure you discuss any questions you have with your health care provider. Document Revised: 12/31/2017 Document Reviewed: 12/31/2017 Elsevier Patient Education  2021 Elsevier Inc.  

## 2020-02-17 NOTE — Assessment & Plan Note (Signed)
Uses symbicort prn and singulair and proair prn.

## 2020-02-17 NOTE — Assessment & Plan Note (Signed)
Stable with cymbalta 

## 2020-02-17 NOTE — Assessment & Plan Note (Signed)
Checking lipid panel and adjust as needed. Not on statin. 

## 2020-02-17 NOTE — Assessment & Plan Note (Signed)
BP at goal, EKG done without changes today from prior.

## 2020-02-17 NOTE — Progress Notes (Signed)
° °  Subjective:   Patient ID: Gina Graves, female    DOB: 28-Oct-1955, 65 y.o.   MRN: [id number]  HPI The patient is a 65 YO female coming in for physical.   PMH, [place], social history reviewed and updated  Review of Systems  Constitutional: Negative.   HENT: Negative.   Eyes: Negative.   Respiratory: Positive for shortness of breath. Negative for cough and chest tightness.   Cardiovascular: Negative for chest pain, palpitations and leg swelling.  Gastrointestinal: Negative for abdominal distention, abdominal pain, constipation, diarrhea, nausea and vomiting.  Musculoskeletal: Negative.   Skin: Negative.   Neurological: Negative.   Psychiatric/Behavioral: Negative.     Objective:  Physical Exam Constitutional:      Appearance: She is well-developed and well-nourished.  HENT:     Head: Normocephalic and atraumatic.  Eyes:     Extraocular Movements: EOM normal.  Cardiovascular:     Rate and Rhythm: Normal rate and regular rhythm.  Pulmonary:     Effort: Pulmonary effort is normal. No respiratory distress.     Breath sounds: Normal breath sounds. No wheezing or rales.  Abdominal:     General: Bowel sounds are normal. There is no distension.     Palpations: Abdomen is soft.     Tenderness: There is no abdominal tenderness. There is no rebound.  Musculoskeletal:        General: No edema.     Cervical back: Normal range of motion.  Skin:    General: Skin is warm and dry.  Neurological:     Mental Status: She is alert and oriented to person, place, and time.     Coordination: Coordination normal.  Psychiatric:        Mood and Affect: Mood and affect normal.    Vitals:   02/17/20 0844  BP: 118/82  Pulse: 71  Resp: 18  Temp: 98.4 F (36.9 C)  TempSrc: Oral  SpO2: 96%  Weight: 172 lb 9.6 oz (78.3 kg)  Height: 5\' 6"  (1.676 m)   EKG: Rate 64, axis normal, interval normal, sinus, no st or t wave changes, no significant change compared to 2020  This visit occurred  during the SARS-CoV-2 public health emergency.  Safety protocols were in place, including screening questions prior to the visit, additional usage of staff PPE, and extensive cleaning of exam room while observing appropriate contact time as indicated for disinfecting solutions.   Assessment & Plan:

## 2020-02-17 NOTE — Assessment & Plan Note (Signed)
Flu shot up to date. Covid-19 up to date including booster. Shingrix complete. Tetanus up to date. Colonoscopy up to date. Mammogram up to date, pap smear up to date. Counseled about sun safety and mole surveillance. Counseled about the dangers of distracted driving. Given 10 year screening recommendations.

## 2020-03-08 ENCOUNTER — Other Ambulatory Visit (HOSPITAL_COMMUNITY): Payer: Self-pay | Admitting: Psychiatry

## 2020-03-08 DIAGNOSIS — F341 Dysthymic disorder: Secondary | ICD-10-CM | POA: Diagnosis not present

## 2020-03-10 DIAGNOSIS — S60222A Contusion of left hand, initial encounter: Secondary | ICD-10-CM | POA: Diagnosis not present

## 2020-04-03 ENCOUNTER — Other Ambulatory Visit (HOSPITAL_COMMUNITY): Payer: Self-pay | Admitting: Sports Medicine

## 2020-04-03 DIAGNOSIS — S139XXA Sprain of joints and ligaments of unspecified parts of neck, initial encounter: Secondary | ICD-10-CM | POA: Diagnosis not present

## 2020-05-11 ENCOUNTER — Other Ambulatory Visit (HOSPITAL_COMMUNITY): Payer: Self-pay

## 2020-05-11 ENCOUNTER — Encounter: Payer: Self-pay | Admitting: Internal Medicine

## 2020-05-11 ENCOUNTER — Other Ambulatory Visit: Payer: Self-pay

## 2020-05-11 DIAGNOSIS — L710 Perioral dermatitis: Secondary | ICD-10-CM | POA: Diagnosis not present

## 2020-05-11 DIAGNOSIS — L719 Rosacea, unspecified: Secondary | ICD-10-CM | POA: Diagnosis not present

## 2020-05-11 MED ORDER — KETOCONAZOLE 2 % EX SHAM
MEDICATED_SHAMPOO | CUTANEOUS | 2 refills | Status: DC
  Filled 2020-05-11: qty 120, 30d supply, fill #0
  Filled 2021-04-19: qty 120, 30d supply, fill #1

## 2020-05-11 MED ORDER — NYSTATIN-TRIAMCINOLONE 100000-0.1 UNIT/GM-% EX OINT
1.0000 "application " | TOPICAL_OINTMENT | Freq: Two times a day (BID) | CUTANEOUS | 3 refills | Status: DC
  Filled 2020-05-11: qty 45, 30d supply, fill #0

## 2020-05-12 ENCOUNTER — Other Ambulatory Visit (HOSPITAL_COMMUNITY): Payer: Self-pay

## 2020-05-12 ENCOUNTER — Other Ambulatory Visit: Payer: Self-pay

## 2020-05-12 MED ORDER — DULOXETINE HCL 60 MG PO CPEP
120.0000 mg | ORAL_CAPSULE | Freq: Every day | ORAL | 0 refills | Status: DC
  Filled 2020-05-12: qty 180, 90d supply, fill #0

## 2020-05-12 MED FILL — Montelukast Sodium Tab 10 MG (Base Equiv): ORAL | 90 days supply | Qty: 90 | Fill #0 | Status: AC

## 2020-05-17 DIAGNOSIS — M9902 Segmental and somatic dysfunction of thoracic region: Secondary | ICD-10-CM | POA: Diagnosis not present

## 2020-05-17 DIAGNOSIS — M9901 Segmental and somatic dysfunction of cervical region: Secondary | ICD-10-CM | POA: Diagnosis not present

## 2020-05-17 DIAGNOSIS — M9905 Segmental and somatic dysfunction of pelvic region: Secondary | ICD-10-CM | POA: Diagnosis not present

## 2020-05-17 DIAGNOSIS — J329 Chronic sinusitis, unspecified: Secondary | ICD-10-CM | POA: Diagnosis not present

## 2020-05-17 DIAGNOSIS — M9903 Segmental and somatic dysfunction of lumbar region: Secondary | ICD-10-CM | POA: Diagnosis not present

## 2020-05-22 ENCOUNTER — Other Ambulatory Visit: Payer: Self-pay | Admitting: Obstetrics and Gynecology

## 2020-05-23 ENCOUNTER — Other Ambulatory Visit (HOSPITAL_COMMUNITY): Payer: Self-pay

## 2020-05-26 ENCOUNTER — Other Ambulatory Visit (HOSPITAL_COMMUNITY): Payer: Self-pay

## 2020-05-26 ENCOUNTER — Other Ambulatory Visit: Payer: Self-pay | Admitting: Obstetrics and Gynecology

## 2020-05-29 ENCOUNTER — Other Ambulatory Visit (HOSPITAL_COMMUNITY): Payer: Self-pay

## 2020-05-29 MED ORDER — VALACYCLOVIR HCL 500 MG PO TABS
500.0000 mg | ORAL_TABLET | Freq: Every day | ORAL | 0 refills | Status: DC | PRN
  Filled 2020-05-29: qty 30, 30d supply, fill #0

## 2020-06-15 ENCOUNTER — Encounter: Payer: Self-pay | Admitting: Internal Medicine

## 2020-06-15 DIAGNOSIS — R635 Abnormal weight gain: Secondary | ICD-10-CM

## 2020-06-24 ENCOUNTER — Encounter: Payer: Self-pay | Admitting: Nurse Practitioner

## 2020-06-24 ENCOUNTER — Telehealth: Payer: 59 | Admitting: Nurse Practitioner

## 2020-06-24 DIAGNOSIS — L247 Irritant contact dermatitis due to plants, except food: Secondary | ICD-10-CM

## 2020-06-24 MED ORDER — PREDNISONE 10 MG PO TABS: ORAL_TABLET | ORAL | 0 refills | Status: DC

## 2020-06-24 MED ORDER — DESOXIMETASONE 0.25 % EX CREA: 1.0000 "application " | TOPICAL_CREAM | Freq: Two times a day (BID) | CUTANEOUS | 0 refills | Status: DC

## 2020-06-24 NOTE — Progress Notes (Signed)
Ms. Gina Graves, [place] are scheduled for a virtual visit with Gina Graves< FNP today.    Just as we do with appointments in the office, we must obtain your consent to participate.  Your consent will be active for this visit and any virtual visit you may have with one of our providers in the next 365 days.    If you have a MyChart account, I can also send a copy of this consent to you electronically.  All virtual visits are billed to your insurance company just like a traditional visit in the office.  As this is a virtual visit, video technology does not allow for your provider to perform a traditional examination.  This may limit your provider's ability to fully assess your condition.  If your provider identifies any concerns that need to be evaluated in person or the need to arrange testing such as labs, EKG, etc, we will make arrangements to do so.    Although advances in technology are sophisticated, we cannot ensure that it will always work on either your end or our end.  If the connection with a video visit is poor, we may have to switch to a telephone visit.  With either a video or telephone visit, we are not always able to ensure that we have a secure connection.   I need to obtain your verbal consent now.   Are you willing to proceed with your visit today?   CHERRIL Graves has provided verbal consent on 06/24/2020 for a virtual visit (video or telephone).  Virtual Visit via Video  I connected with  Gina Graves  on 06/24/20 at 12:25 by video and verified that I am speaking with the correct person using two identifiers. SIRENA Graves is currently located at home  and no one  is currently with her during visit. The provider, Gina Hassell Done, FNP is located at home at time of visit.  I discussed the limitations, risks, security and privacy concerns of performing an evaluation and management service by video  and the availability of in person appointments. I also discussed  with the patient that there may be a patient responsible charge related to this service. The patient expressed understanding and agreed to proceed.   Subjective:   HPI:   Patient calls in stating that she has been working in her garden over the last week and has developed a rash . Started on right hand and has spread to left hane  And right side of face and around to back of neck. Rash looks like raised blisters and is very itchy.  Review of Systems  Respiratory: Negative for wheezing.   Cardiovascular: Negative for leg swelling.  All other systems reviewed and are negative.    See pertinent positives and negatives per HPI.  Patient Active Problem List   Diagnosis Date Noted  . Chronic maxillary sinusitis 10/20/2017  . Dysfunction of left eustachian tube 10/20/2017  . Joint swelling 02/06/2017  . Routine general medical examination at a health care facility 06/09/2015  . Heart murmur 06/22/2014  . Hyperlipidemia 03/13/2014  . Family history of early CAD 03/04/2014  . Osteopenia 05/10/2009  . Asthma, mild intermittent 04/25/2008  . Depression 01/28/2007  . Allergic rhinitis 01/28/2007    Social History   Tobacco Use  . Smoking status: Never Smoker  . Smokeless tobacco: Never Used  . Tobacco comment: second hand smoke X 25 years  Substance Use Topics  . Alcohol use: Yes    Alcohol/week: 2.0  standard drinks    Types: 2 Shots of liquor per week    Comment:  < 2/ week    Current Outpatient Medications:  .  desoximetasone (TOPICORT) 0.25 % cream, Apply 1 application topically 2 (two) times daily., Disp: 60 g, Rfl: 0 .  predniSONE (DELTASONE) 10 MG tablet, Days 1-4 take 4 tablets (40 mg) daily  Days 5-8 take 3 tablets (30 mg) daily, Days 9-11 take 2 tablets (20 mg) daily, Days 12-14 take 1 tablet (10 mg) daily., Disp: 37 tablet, Rfl: 0 .  Acetylcysteine (N-ACETYL-L-CYSTEINE) 600 MG CAPS, Take by mouth., Disp: , Rfl:  .  albuterol (VENTOLIN HFA) 108 (90 Base) MCG/ACT inhaler,  Inhale 2 puffs into the lungs every 4 (four) hours as needed for wheezing or shortness of breath (wheezing or shortness of breath)., Disp: 18 g, Rfl: 1 .  ALPRAZolam (XANAX) 0.5 MG tablet, Take 0.5 mg by mouth as needed (anxiety)., Disp: , Rfl:  .  budesonide-formoterol (SYMBICORT) 160-4.5 MCG/ACT inhaler, INHALE 2 PUFFS INTO THE LUNGS 2 (TWO) TIMES DAILY., Disp: 10.2 g, Rfl: 1 .  cetirizine (ZYRTEC) 10 MG tablet, Take 10 mg by mouth daily., Disp: , Rfl:  .  Cholecalciferol (VITAMIN D3) 2000 UNITS TABS, Take 2,000 Units by mouth daily as needed (vitamin supplement). , Disp: , Rfl:  .  cyclobenzaprine (FLEXERIL) 10 MG tablet, TAKE 1 TABLET BY MOUTH EVERY EIGHT TO TWELVE HOURS AS NEEDED, Disp: 30 tablet, Rfl: 1 .  DULoxetine (CYMBALTA) 60 MG capsule, Take 120 mg by mouth daily. 2 by mouth daily, Disp: , Rfl:  .  DULoxetine (CYMBALTA) 60 MG capsule, TAKE 2 CAPSULES BY MOUTH ONCE A DAY, Disp: 180 capsule, Rfl: 3 .  DULoxetine (CYMBALTA) 60 MG capsule, Take 2 capsules (120 mg total) by mouth daily., Disp: 180 capsule, Rfl: 0 .  fluticasone (FLONASE) 50 MCG/ACT nasal spray, Place 1 spray into both nostrils 2 (two) times daily as needed for allergies or rhinitis (for sinus congestion)., Disp: , Rfl:  .  ibuprofen (ADVIL) 800 MG tablet, TAKE 1 TABLET (800 MG TOTAL) BY MOUTH EVERY 8 (EIGHT) HOURS AS NEEDED., Disp: 90 tablet, Rfl: 1 .  ketoconazole (NIZORAL) 2 % shampoo, Apply approximately 39ml externally once a day, Disp: 120 mL, Rfl: 2 .  meloxicam (MOBIC) 15 MG tablet, TAKE 1 TABLET BY MOUTH ONCE A DAY WITH FOOD FOR INFLAMMATION, Disp: 60 tablet, Rfl: 2 .  montelukast (SINGULAIR) 10 MG tablet, TAKE 1 TABLET BY MOUTH DAILY, Disp: 90 tablet, Rfl: 3 .  nystatin-triamcinolone ointment (MYCOLOG), Apply 1 application topically 2 (two) times daily., Disp: 100 g, Rfl: 3 .  omeprazole (PRILOSEC) 20 MG capsule, TAKE 1 CAPSULE BY MOUTH 2 TIMES DAILY BEFORE A MEAL., Disp: 180 capsule, Rfl: 3 .  traZODone (DESYREL) 50  MG tablet, Take 50 mg by mouth. 1/2-1 tablet at bedtime, Disp: , Rfl:  .  traZODone (DESYREL) 50 MG tablet, TAKE 1 TO 2 TABLETS BY MOUTH AT BEDTIME, Disp: 180 tablet, Rfl: 3 .  traZODone (DESYREL) 50 MG tablet, TAKE 1 TO 2 TABLETS BY MOUTH AT BEDTIME, Disp: 180 tablet, Rfl: 3 .  TURMERIC PO, Take 1 tablet by mouth daily as needed (supplement). , Disp: , Rfl:  .  valACYclovir (VALTREX) 500 MG tablet, Take 500 mg by mouth 2 (two) times daily as needed (cold sores)., Disp: , Rfl:  .  valACYclovir (VALTREX) 500 MG tablet, Take 1 tablet (500 mg total) by mouth daily as needed., Disp: 30 tablet, Rfl: 0  Allergies  Allergen Reactions  . Bupropion Hcl     REACTION: headaches    Objective:   There were no vitals taken for this visit.  Patient is well-developed, well-nourished in no acute distress.  Resting comfortably on her couch at home.  Head is normocephalic, atraumatic.  No labored breathing.  Speech is clear and coherent with logical content.  Patient is alert and oriented at baseline.  Vesicular erythematous rash on right chin, down neck and around to back  Of neck. Vesicular lesion on dorsal surface of bil hands  Assessment and Plan:        Judeen Hammans in today with chief complaint of No chief complaint on file.   1. Irritant contact dermatitis due to plants, except food Cool compresses Avoid scratching Use steroid cream very sparingly on face Benadryl and calamine OTC may help Meds ordered this encounter  Medications  . predniSONE (DELTASONE) 10 MG tablet    Sig: Days 1-4 take 4 tablets (40 mg) daily  Days 5-8 take 3 tablets (30 mg) daily, Days 9-11 take 2 tablets (20 mg) daily, Days 12-14 take 1 tablet (10 mg) daily.    Dispense:  37 tablet    Refill:  0    Order Specific Question:   Supervising Provider    Answer:   MILLER, BRIAN [[id number]]  . desoximetasone (TOPICORT) 0.25 % cream    Sig: Apply 1 application topically 2 (two) times daily.    Dispense:  60 g     Refill:  0    Order Specific Question:   Supervising Provider    Answer:   Noemi Chapel [[id number]]       The above assessment and management plan was discussed with the patient. The patient verbalized understanding of and has agreed to the management plan. Patient is aware to call the clinic if symptoms persist or worsen. Patient is aware when to return to the clinic for a follow-up visit. Patient educated on when it is appropriate to go to the emergency department.   [place], FNP  .   [place], [place] 06/24/2020  Time spent with the patient: 10 minutes, of which >50% was spent in obtaining information about symptoms, reviewing previous labs, evaluations, and treatments, counseling about condition (please see the discussed topics above), and developing a plan to further investigate it; had a number of questions which I addressed.

## 2020-06-24 NOTE — Patient Instructions (Signed)
Poison Ivy Dermatitis Poison ivy dermatitis is redness and soreness of the skin caused by chemicals in the leaves of the poison ivy plant. You may have very bad itching, swelling, a rash, and blisters. What are the causes?  Touching a poison ivy plant.  Touching something that has the chemical on it. This may include animals or objects that have come in contact with the plant. What increases the risk?  Going outdoors often in wooded or [place] areas.  Going outdoors without wearing protective clothing, such as closed shoes, long pants, and a long-sleeved shirt. What are the signs or symptoms?  Skin redness.  Very bad itching.  A rash that often includes bumps and blisters. ? The rash usually appears 48 hours after exposure, if you have been exposed before. ? If this is the first time you have been exposed, the rash may not appear until a week after exposure.  Swelling. This may occur if the reaction is very bad. Symptoms usually last for 1-2 weeks. The first time you develop this condition, symptoms may last 3-4 weeks.   How is this treated? This condition may be treated with:  Hydrocortisone cream or calamine lotion to relieve itching.  Oatmeal baths to soothe the skin.  Medicines, such as over-the-counter antihistamine tablets.  Oral steroid medicine for more severe reactions. Follow these instructions at home: Medicines  Take or apply over-the-counter and prescription medicines only as told by your doctor.  Use hydrocortisone cream or calamine lotion as needed to help with itching. General instructions  Do not scratch or rub your skin.  Put a cold, wet cloth (cold compress) on the affected areas or take baths in cool water. This will help with itching.  Avoid hot baths and showers.  Take oatmeal baths as needed. Use colloidal oatmeal. You can get this at a pharmacy or grocery store. Follow the instructions on the package.  While you have the rash, wash your clothes  right after you wear them.  Keep all follow-up visits as told by your health care provider. This is important. How is this prevented?  Know what poison ivy looks like, so you can avoid it. ? This plant has three leaves with flowering branches on a single stem. ? The leaves are glossy. ? The leaves have uneven edges that come to a point at the front.  If you touch poison ivy, wash your skin with soap and water right away. Be sure to wash under your fingernails.  When hiking or camping, wear long pants, a long-sleeved shirt, tall socks, and hiking boots. You can also use a lotion on your skin that helps to prevent contact with poison ivy.  If you think that your clothes or outdoor gear came in contact with poison ivy, rinse them off with a garden hose before you bring them inside your house.  When doing yard work or gardening, wear gloves, long sleeves, long pants, and boots. Wash your garden tools and gloves if they come in contact with poison ivy.  If you think that your pet has come into contact with poison ivy, wash him or her with pet shampoo and water. Make sure to wear gloves while washing your pet.   Contact a doctor if:  You have open sores in the rash area.  You have more redness, swelling, or pain in the rash area.  You have redness that spreads beyond the rash area.  You have fluid, blood, or pus coming from the rash area.  You  have a fever.  You have a rash over a large area of your body.  You have a rash on your eyes, mouth, or genitals.  Your rash does not get better after a few weeks. Get help right away if:  Your face swells or your eyes swell shut.  You have trouble breathing.  You have trouble swallowing. These symptoms may be an emergency. Do not wait to see if the symptoms will go away. Get medical help right away. Call your local emergency services (911 in the [place] Do not drive yourself to the hospital. Summary  Poison ivy dermatitis is redness and  soreness of the skin caused by chemicals in the leaves of the poison ivy plant.  You may have skin redness, very bad itching, swelling, and a rash.  Do not scratch or rub your skin.  Take or apply over-the-counter and prescription medicines only as told by your doctor. This information is not intended to replace advice given to you by your health care provider. Make sure you discuss any questions you have with your health care provider. Document Revised: 05/01/2018 Document Reviewed: 01/02/2018 Elsevier Patient Education  2021 [employer].

## 2020-07-07 ENCOUNTER — Other Ambulatory Visit: Payer: Self-pay

## 2020-07-10 ENCOUNTER — Other Ambulatory Visit (HOSPITAL_COMMUNITY): Payer: Self-pay

## 2020-07-10 MED FILL — Trazodone HCl Tab 50 MG: ORAL | 90 days supply | Qty: 180 | Fill #0 | Status: AC

## 2020-07-17 ENCOUNTER — Other Ambulatory Visit (HOSPITAL_COMMUNITY): Payer: Self-pay

## 2020-07-17 MED FILL — Ibuprofen Tab 800 MG: ORAL | 30 days supply | Qty: 90 | Fill #0 | Status: AC

## 2020-07-17 MED FILL — Ibuprofen Tab 800 MG: ORAL | 30 days supply | Qty: 90 | Fill #0 | Status: CN

## 2020-07-25 ENCOUNTER — Other Ambulatory Visit (HOSPITAL_COMMUNITY): Payer: Self-pay

## 2020-07-25 DIAGNOSIS — M25512 Pain in left shoulder: Secondary | ICD-10-CM | POA: Diagnosis not present

## 2020-07-25 DIAGNOSIS — S139XXA Sprain of joints and ligaments of unspecified parts of neck, initial encounter: Secondary | ICD-10-CM | POA: Diagnosis not present

## 2020-07-25 MED ORDER — PREDNISONE 10 MG (48) PO TBPK
ORAL_TABLET | ORAL | 0 refills | Status: DC
  Filled 2020-07-25: qty 48, 12d supply, fill #0

## 2020-08-01 DIAGNOSIS — M542 Cervicalgia: Secondary | ICD-10-CM | POA: Diagnosis not present

## 2020-08-03 DIAGNOSIS — S139XXD Sprain of joints and ligaments of unspecified parts of neck, subsequent encounter: Secondary | ICD-10-CM | POA: Diagnosis not present

## 2020-08-04 ENCOUNTER — Other Ambulatory Visit: Payer: Self-pay | Admitting: Sports Medicine

## 2020-08-04 ENCOUNTER — Other Ambulatory Visit (HOSPITAL_COMMUNITY): Payer: Self-pay

## 2020-08-04 DIAGNOSIS — S161XXA Strain of muscle, fascia and tendon at neck level, initial encounter: Secondary | ICD-10-CM

## 2020-08-04 MED FILL — Cyclobenzaprine HCl Tab 10 MG: ORAL | 10 days supply | Qty: 30 | Fill #0 | Status: AC

## 2020-08-08 ENCOUNTER — Other Ambulatory Visit: Payer: Self-pay

## 2020-08-08 ENCOUNTER — Ambulatory Visit
Admission: RE | Admit: 2020-08-08 | Discharge: 2020-08-08 | Disposition: A | Discharge status: Home / Self Care | Payer: 59 | Source: Ambulatory Visit | Attending: Sports Medicine | Admitting: Sports Medicine

## 2020-08-08 DIAGNOSIS — S161XXA Strain of muscle, fascia and tendon at neck level, initial encounter: Secondary | ICD-10-CM

## 2020-08-08 DIAGNOSIS — M47812 Spondylosis without myelopathy or radiculopathy, cervical region: Secondary | ICD-10-CM | POA: Diagnosis not present

## 2020-08-08 MED ORDER — IOPAMIDOL (ISOVUE-M 300) INJECTION 61%
1.0000 mL | Freq: Once | INTRAMUSCULAR | Status: AC | PRN
  Administered 2020-08-08: 1 mL via EPIDURAL

## 2020-08-08 MED ORDER — TRIAMCINOLONE ACETONIDE 40 MG/ML IJ SUSP (RADIOLOGY)
60.0000 mg | Freq: Once | INTRAMUSCULAR | Status: AC
  Administered 2020-08-08: 60 mg via EPIDURAL

## 2020-08-08 NOTE — Discharge Instructions (Signed)

## 2020-08-09 ENCOUNTER — Other Ambulatory Visit (HOSPITAL_COMMUNITY): Payer: Self-pay

## 2020-08-09 MED FILL — Montelukast Sodium Tab 10 MG (Base Equiv): ORAL | 90 days supply | Qty: 90 | Fill #1 | Status: AC

## 2020-08-10 ENCOUNTER — Other Ambulatory Visit (HOSPITAL_COMMUNITY): Payer: Self-pay

## 2020-08-11 ENCOUNTER — Other Ambulatory Visit (HOSPITAL_COMMUNITY): Payer: Self-pay

## 2020-08-11 MED ORDER — DULOXETINE HCL 60 MG PO CPEP
120.0000 mg | ORAL_CAPSULE | Freq: Every day | ORAL | 0 refills | Status: DC
  Filled 2020-08-11: qty 180, 90d supply, fill #0

## 2020-08-13 ENCOUNTER — Other Ambulatory Visit: Payer: Self-pay

## 2020-08-14 ENCOUNTER — Other Ambulatory Visit (HOSPITAL_COMMUNITY): Payer: Self-pay

## 2020-08-14 MED ORDER — ALPRAZOLAM 0.5 MG PO TABS
0.2500 mg | ORAL_TABLET | Freq: Two times a day (BID) | ORAL | 1 refills | Status: DC
  Filled 2020-08-14: qty 180, 90d supply, fill #0
  Filled 2020-11-14: qty 180, 90d supply, fill #1

## 2020-09-01 ENCOUNTER — Other Ambulatory Visit (HOSPITAL_COMMUNITY): Payer: Self-pay

## 2020-09-01 DIAGNOSIS — D485 Neoplasm of uncertain behavior of skin: Secondary | ICD-10-CM | POA: Diagnosis not present

## 2020-09-01 DIAGNOSIS — R61 Generalized hyperhidrosis: Secondary | ICD-10-CM | POA: Diagnosis not present

## 2020-09-01 MED ORDER — CLONIDINE HCL 0.1 MG PO TABS
0.1000 mg | ORAL_TABLET | Freq: Two times a day (BID) | ORAL | 2 refills | Status: DC
  Filled 2020-09-01: qty 60, 30d supply, fill #0

## 2020-09-01 MED ORDER — DRYSOL 20 % EX SOLN
1.0000 "application " | Freq: Every day | CUTANEOUS | 2 refills | Status: DC
  Filled 2020-09-01: qty 35, 30d supply, fill #0

## 2020-09-05 DIAGNOSIS — M5000 Cervical disc disorder with myelopathy, unspecified cervical region: Secondary | ICD-10-CM | POA: Diagnosis not present

## 2020-10-03 MED FILL — Trazodone HCl Tab 50 MG: ORAL | 90 days supply | Qty: 180 | Fill #1 | Status: AC

## 2020-10-04 ENCOUNTER — Other Ambulatory Visit (HOSPITAL_COMMUNITY): Payer: Self-pay

## 2020-10-16 ENCOUNTER — Encounter: Payer: Self-pay | Admitting: Internal Medicine

## 2020-10-16 ENCOUNTER — Telehealth: Payer: 59 | Admitting: Physician Assistant

## 2020-10-16 ENCOUNTER — Other Ambulatory Visit: Payer: Self-pay | Admitting: Internal Medicine

## 2020-10-16 ENCOUNTER — Other Ambulatory Visit (HOSPITAL_COMMUNITY): Payer: Self-pay

## 2020-10-16 ENCOUNTER — Telehealth: Payer: Self-pay

## 2020-10-16 DIAGNOSIS — U071 COVID-19: Secondary | ICD-10-CM | POA: Diagnosis not present

## 2020-10-16 DIAGNOSIS — J4541 Moderate persistent asthma with (acute) exacerbation: Secondary | ICD-10-CM

## 2020-10-16 MED ORDER — BUDESONIDE-FORMOTEROL FUMARATE 160-4.5 MCG/ACT IN AERO
2.0000 | INHALATION_SPRAY | Freq: Two times a day (BID) | RESPIRATORY_TRACT | 1 refills | Status: DC
  Filled 2020-10-16: qty 10.2, 30d supply, fill #0

## 2020-10-16 MED ORDER — ALBUTEROL SULFATE HFA 108 (90 BASE) MCG/ACT IN AERS
2.0000 | INHALATION_SPRAY | RESPIRATORY_TRACT | 1 refills | Status: DC | PRN
  Filled 2020-10-16: qty 18, 17d supply, fill #0
  Filled 2021-10-04: qty 18, 17d supply, fill #1

## 2020-10-16 MED ORDER — MOLNUPIRAVIR EUA 200MG CAPSULE: 4.0000 | ORAL_CAPSULE | Freq: Two times a day (BID) | ORAL | 0 refills | Status: AC

## 2020-10-16 MED ORDER — BENZONATATE 100 MG PO CAPS: 100.0000 mg | ORAL_CAPSULE | Freq: Three times a day (TID) | ORAL | 0 refills | Status: DC | PRN

## 2020-10-16 NOTE — Telephone Encounter (Signed)
Called Pt and LMOM. Also sent message via MyChart.

## 2020-10-16 NOTE — Progress Notes (Signed)
Virtual Visit Consent   Gina Graves, you are scheduled for a virtual visit with a Friend provider today.     Just as with appointments in the office, your consent must be obtained to participate.  Your consent will be active for this visit and any virtual visit you may have with one of our providers in the next 365 days.     If you have a MyChart account, a copy of this consent can be sent to you electronically.  All virtual visits are billed to your insurance company just like a traditional visit in the office.    As this is a virtual visit, video technology does not allow for your provider to perform a traditional examination.  This may limit your provider's ability to fully assess your condition.  If your provider identifies any concerns that need to be evaluated in person or the need to arrange testing (such as labs, EKG, etc.), we will make arrangements to do so.     Although advances in technology are sophisticated, we cannot ensure that it will always work on either your end or our end.  If the connection with a video visit is poor, the visit may have to be switched to a telephone visit.  With either a video or telephone visit, we are not always able to ensure that we have a secure connection.     I need to obtain your verbal consent now.   Are you willing to proceed with your visit today?    Gina Graves has provided verbal consent on 10/16/2020 for a virtual visit (video or telephone).   Mar Daring, PA-C   Date: 10/16/2020 8:54 AM   Virtual Visit via Video Note   I, Mar Daring, connected with  Gina Graves  ([id number], 02-04-1955) on 10/16/20 at  8:45 AM EDT by a video-enabled telemedicine application and verified that I am speaking with the correct person using two identifiers.  Location: Patient: Virtual Visit Location Patient: Home Provider: Virtual Visit Location Provider: Home Office   I discussed the limitations of evaluation and  management by telemedicine and the availability of in person appointments. The patient expressed understanding and agreed to proceed.    History of Present Illness: Gina Graves is a 65 y.o. who identifies as a female who was assigned female at birth, and is being seen today for Covid 23.  HPI: URI  This is a new problem. Episode onset: Tested positive this morning for covid 19. There has been no fever. Associated symptoms include congestion, coughing, ear pain (from sore throat on left side), headaches and a sore throat. Pertinent negatives include no diarrhea, nausea, plugged ear sensation, rhinorrhea, sinus pain or vomiting. Associated symptoms comments: Post nasal drainage. Treatments tried: tylenol, thera-flu. The treatment provided mild relief.     Problems:  Patient Active Problem List   Diagnosis Date Noted   Chronic maxillary sinusitis 10/20/2017   Dysfunction of left eustachian tube 10/20/2017   Joint swelling 02/06/2017   Routine general medical examination at a health care facility 06/09/2015   Heart murmur 06/22/2014   Hyperlipidemia 03/13/2014   Family history of early CAD 03/04/2014   Osteopenia 05/10/2009   Asthma, mild intermittent 04/25/2008   Depression 01/28/2007   Allergic rhinitis 01/28/2007    Allergies:  Allergies  Allergen Reactions   Bupropion Hcl     REACTION: headaches   Medications:  Current Outpatient Medications:    benzonatate (TESSALON) 100 MG capsule,  Take 1 capsule (100 mg total) by mouth 3 (three) times daily as needed., Disp: 30 capsule, Rfl: 0   molnupiravir EUA (LAGEVRIO) 200 mg CAPS capsule, Take 4 capsules (800 mg total) by mouth 2 (two) times daily for 5 days., Disp: 40 capsule, Rfl: 0   Acetylcysteine (N-ACETYL-L-CYSTEINE) 600 MG CAPS, Take by mouth., Disp: , Rfl:    albuterol (VENTOLIN HFA) 108 (90 Base) MCG/ACT inhaler, Inhale 2 puffs into the lungs every 4 (four) hours as needed for wheezing or shortness of breath (wheezing or  shortness of breath)., Disp: 18 g, Rfl: 1   ALPRAZolam (XANAX) 0.5 MG tablet, Take 0.5 mg by mouth as needed (anxiety)., Disp: , Rfl:    ALPRAZolam (XANAX) 0.5 MG tablet, Take 0.5-1 tablets (0.25-0.5 mg total) by mouth 2 (two) times daily., Disp: 180 tablet, Rfl: 1   aluminum chloride (DRYSOL) 20 % external solution, Apply 1 application topically at bedtime., Disp: 35 mL, Rfl: 2   budesonide-formoterol (SYMBICORT) 160-4.5 MCG/ACT inhaler, INHALE 2 PUFFS INTO THE LUNGS 2 (TWO) TIMES DAILY., Disp: 10.2 g, Rfl: 1   cetirizine (ZYRTEC) 10 MG tablet, Take 10 mg by mouth daily., Disp: , Rfl:    Cholecalciferol (VITAMIN D3) 2000 UNITS TABS, Take 2,000 Units by mouth daily as needed (vitamin supplement). , Disp: , Rfl:    cloNIDine (CATAPRES) 0.1 MG tablet, Take 1 tablet (0.1 mg total) by mouth 1-2 times daily., Disp: 60 tablet, Rfl: 2   cyclobenzaprine (FLEXERIL) 10 MG tablet, TAKE 1 TABLET BY MOUTH EVERY EIGHT TO TWELVE HOURS AS NEEDED, Disp: 30 tablet, Rfl: 1   desoximetasone (TOPICORT) 0.25 % cream, Apply 1 application topically 2 (two) times daily., Disp: 60 g, Rfl: 0   DULoxetine (CYMBALTA) 60 MG capsule, Take 120 mg by mouth daily. 2 by mouth daily, Disp: , Rfl:    DULoxetine (CYMBALTA) 60 MG capsule, TAKE 2 CAPSULES BY MOUTH ONCE A DAY, Disp: 180 capsule, Rfl: 3   DULoxetine (CYMBALTA) 60 MG capsule, Take 2 capsules (120 mg total) by mouth daily., Disp: 180 capsule, Rfl: 0   DULoxetine (CYMBALTA) 60 MG capsule, Take 2 capsules (120 mg total) by mouth daily., Disp: 180 capsule, Rfl: 0   fluticasone (FLONASE) 50 MCG/ACT nasal spray, Place 1 spray into both nostrils 2 (two) times daily as needed for allergies or rhinitis (for sinus congestion)., Disp: , Rfl:    ibuprofen (ADVIL) 800 MG tablet, TAKE 1 TABLET (800 MG TOTAL) BY MOUTH EVERY 8 (EIGHT) HOURS AS NEEDED., Disp: 90 tablet, Rfl: 1   ketoconazole (NIZORAL) 2 % shampoo, Apply approximately 44ml externally once a day, Disp: 120 mL, Rfl: 2    meloxicam (MOBIC) 15 MG tablet, TAKE 1 TABLET BY MOUTH ONCE A DAY WITH FOOD FOR INFLAMMATION, Disp: 60 tablet, Rfl: 2   montelukast (SINGULAIR) 10 MG tablet, TAKE 1 TABLET BY MOUTH DAILY, Disp: 90 tablet, Rfl: 3   nystatin-triamcinolone ointment (MYCOLOG), Apply 1 application topically 2 (two) times daily., Disp: 100 g, Rfl: 3   omeprazole (PRILOSEC) 20 MG capsule, TAKE 1 CAPSULE BY MOUTH 2 TIMES DAILY BEFORE A MEAL., Disp: 180 capsule, Rfl: 3   predniSONE (DELTASONE) 10 MG tablet, Days 1-4 take 4 tablets (40 mg) daily  Days 5-8 take 3 tablets (30 mg) daily, Days 9-11 take 2 tablets (20 mg) daily, Days 12-14 take 1 tablet (10 mg) daily., Disp: 37 tablet, Rfl: 0   predniSONE (STERAPRED UNI-PAK 48 TAB) 10 MG (48) TBPK tablet, use as directed on package for 12  days, Disp: 48 tablet, Rfl: 0   traZODone (DESYREL) 50 MG tablet, Take 50 mg by mouth. 1/2-1 tablet at bedtime, Disp: , Rfl:    traZODone (DESYREL) 50 MG tablet, TAKE 1 TO 2 TABLETS BY MOUTH AT BEDTIME, Disp: 180 tablet, Rfl: 3   traZODone (DESYREL) 50 MG tablet, TAKE 1 TO 2 TABLETS BY MOUTH AT BEDTIME, Disp: 180 tablet, Rfl: 3   TURMERIC PO, Take 1 tablet by mouth daily as needed (supplement). , Disp: , Rfl:    valACYclovir (VALTREX) 500 MG tablet, Take 500 mg by mouth 2 (two) times daily as needed (cold sores)., Disp: , Rfl:    valACYclovir (VALTREX) 500 MG tablet, Take 1 tablet (500 mg total) by mouth daily as needed., Disp: 30 tablet, Rfl: 0  Observations/Objective: Patient is well-developed, well-nourished in no acute distress.  Resting comfortably at home.  Head is normocephalic, atraumatic.  No labored breathing.  Speech is clear and coherent with logical content.  Patient is alert and oriented at baseline.    Assessment and Plan: 1. COVID-19 - MyChart COVID-19 home monitoring program; Future - molnupiravir EUA (LAGEVRIO) 200 mg CAPS capsule; Take 4 capsules (800 mg total) by mouth 2 (two) times daily for 5 days.  Dispense: 40  capsule; Refill: 0 - benzonatate (TESSALON) 100 MG capsule; Take 1 capsule (100 mg total) by mouth 3 (three) times daily as needed.  Dispense: 30 capsule; Refill: 0  - Continue OTC symptomatic management of choice - Will send OTC vitamins and supplement information through AVS - Molnupiravir prescribed - Tessalon perles for cough - Patient enrolled in MyChart symptom monitoring - Push fluids - Rest as needed - Discussed return precautions and when to seek in-person evaluation, sent via AVS as well  Follow Up Instructions: I discussed the assessment and treatment plan with the patient. The patient was provided an opportunity to ask questions and all were answered. The patient agreed with the plan and demonstrated an understanding of the instructions.  A copy of instructions were sent to the patient via MyChart unless otherwise noted below.    The patient was advised to call back or seek an in-person evaluation if the symptoms worsen or if the condition fails to improve as anticipated.  Time:  I spent 12 minutes with the patient via telehealth technology discussing the above problems/concerns.    Mar Daring, PA-C

## 2020-10-16 NOTE — Patient Instructions (Signed)
Hello Gina Graves,  You are being placed in the home monitoring program for COVID-19 (commonly known as Coronavirus).  This is because you are suspected to have the virus or are known to have the virus.  If you are unsure which group you fall into call your clinic.    As part of this program, you'll answer a daily questionnaire in the MyChart mobile app. You'll receive a notification through the MyChart app when the questionnaire is available. When you log in to MyChart, you'll see the tasks in your To Do activity.       Clinicians will see any answers that are concerning and take appropriate steps.  If at any point you are having a medical emergency, call 911.  If otherwise concerned call your clinic instead of coming into the clinic or hospital.  To keep from spreading the disease you should: Stay home and limit contact with other people as much as possible.  Wash your hands frequently. Cover your coughs and sneezes with a tissue, and throw used tissues in the trash.   Clean and disinfect frequently touched surfaces and objects.    Take care of yourself by: Staying home Resting Drinking fluids Take fever-reducing medications (Tylenol/Acetaminophen and Ibuprofen)  For more information on the disease go to the Centers for Disease Control and Prevention website     You are being prescribed MOLNUPIRAVIR for COVID-19 infection.   Please call the pharmacy or go through the drive through vs going inside if you are picking up the mediation yourself to prevent further spread. If prescribed to a [facility] affiliated pharmacy, a pharmacist will bring the medication out to your car.   ADMINISTRATION INSTRUCTIONS: Take with or without food. Swallow the tablets whole. Don't chew, crush, or break the medications because it might not work as well  For each dose of the medication, you should be taking FOUR tablets at one time, TWICE a day   Finish your full five-day course of Molnupiravir even if  you feel better before you're done. Stopping this medication too early can make it less effective to prevent severe illness related to [place].    Molnupiravir is prescribed for YOU ONLY. Don't share it with others, even if they have similar symptoms as you. This medication might not be right for everyone.   Make sure to take steps to protect yourself and others while you're taking this medication in order to get well soon and to prevent others from getting sick with COVID-19.   **If you are of childbearing potential (any gender) - it is advised to not get pregnant while taking this medication and recommended that condoms are used for female partners the next 3 months after taking the medication out of extreme caution    COMMON SIDE EFFECTS: Diarrhea Nausea  Dizziness    If your COVID-19 symptoms get worse, get medical help right away. Call 911 if you experience symptoms such as worsening cough, trouble breathing, chest pain that doesn't go away, confusion, a hard time staying awake, and pale or blue-colored skin. This medication won't prevent all COVID-19 cases from getting worse.   Can take to lessen severity: Vit C 500mg  twice daily Quercertin 250-500mg  twice daily Zinc 75-100mg  daily Melatonin 3-6 mg at bedtime Vit D3 1000-2000 IU daily Aspirin 81 mg daily with food Optional: Famotidine 20mg  daily Also can add tylenol/ibuprofen as needed for fevers and body aches May add Mucinex or Mucinex DM as needed for cough/congestion  10 Things You Can Do  to Manage Your COVID-19 Symptoms at Home If you have possible or confirmed COVID-19 Stay home except to get medical care. Monitor your symptoms carefully. If your symptoms get worse, call your healthcare provider immediately. Get rest and stay hydrated. If you have a medical appointment, call the healthcare provider ahead of time and tell them that you have or may have COVID-19. For medical emergencies, call 911 and notify the dispatch  personnel that you have or may have COVID-19. Cover your cough and sneezes with a tissue or use the inside of your elbow. Wash your hands often with soap and water for at least 20 seconds or clean your hands with an alcohol-based hand sanitizer that contains at least 60% alcohol. As much as possible, stay in a specific room and away from other people in your home. Also, you should use a separate bathroom, if available. If you need to be around other people in or outside of the home, wear a mask. Avoid sharing personal items with other people in your household, like dishes, towels, and bedding. Clean all surfaces that are touched often, like counters, tabletops, and doorknobs. Use household cleaning sprays or wipes according to the label instructions. cdc.gov/coronavirus 08/06/2019 This information is not intended to replace advice given to you by your health care provider. Make sure you discuss any questions you have with your health care provider. Document Revised: 05/25/2020 Document Reviewed: 05/25/2020 Elsevier Patient Education  2022 [employer]  

## 2020-10-17 ENCOUNTER — Other Ambulatory Visit (HOSPITAL_COMMUNITY): Payer: Self-pay

## 2020-10-18 ENCOUNTER — Telehealth: Payer: Self-pay | Admitting: *Deleted

## 2020-10-18 NOTE — Telephone Encounter (Signed)
BPA triggered for worsening SOB, decreased appetite. Attempted to reach pt, left VM symptoms will be addressed in questionnaire reply.

## 2020-10-28 ENCOUNTER — Encounter (INDEPENDENT_AMBULATORY_CARE_PROVIDER_SITE_OTHER): Payer: Self-pay

## 2020-10-28 ENCOUNTER — Telehealth: Payer: Self-pay

## 2020-10-28 NOTE — Telephone Encounter (Signed)
Dx covid 10/16/20 took antiviral back to work Yesterday  woke up with head congestion and cough. H/o asthma Woke up this am with headache and chest tight.  Cough is dry. Fever no- SOB at rest.Pt is taking  Dayquil and tessalon Perles.   Shortness of breath is the same:   continue to monitor at home  If symptoms become severe, i.e. shortness of breath at rest, gasping for air, wheezing, call 911 and seek treatment in the ED If cough remains the same or better: continue to treat with over the counter medications.  Hard candy or cough drops and drinking warm fluids. Adults can also use honey 2 tsp (10 ML) at bedtime.  If cough is becoming worse even with the use of over the counter medications and patient is not able to sleep at night, cough becomes productive with sputum that maybe yellow or green in color, contact PCP.   Pt verbalized understanding.

## 2020-10-28 NOTE — Telephone Encounter (Signed)
Pt with worsening cough and SOB. Called pt and LM with call back number

## 2020-11-06 ENCOUNTER — Encounter: Payer: 59 | Admitting: Internal Medicine

## 2020-11-12 ENCOUNTER — Other Ambulatory Visit (HOSPITAL_COMMUNITY): Payer: Self-pay

## 2020-11-12 MED FILL — Montelukast Sodium Tab 10 MG (Base Equiv): ORAL | 90 days supply | Qty: 90 | Fill #2 | Status: AC

## 2020-11-13 ENCOUNTER — Other Ambulatory Visit: Payer: Self-pay

## 2020-11-13 ENCOUNTER — Other Ambulatory Visit (HOSPITAL_COMMUNITY): Payer: Self-pay

## 2020-11-13 ENCOUNTER — Ambulatory Visit (INDEPENDENT_AMBULATORY_CARE_PROVIDER_SITE_OTHER): Payer: 59 | Admitting: Internal Medicine

## 2020-11-13 ENCOUNTER — Encounter: Payer: Self-pay | Admitting: Internal Medicine

## 2020-11-13 DIAGNOSIS — J4521 Mild intermittent asthma with (acute) exacerbation: Secondary | ICD-10-CM

## 2020-11-13 MED ORDER — FLUTICASONE FUROATE-VILANTEROL 200-25 MCG/ACT IN AEPB
1.0000 | INHALATION_SPRAY | Freq: Every day | RESPIRATORY_TRACT | 11 refills | Status: DC
  Filled 2020-11-13: qty 60, 30d supply, fill #0
  Filled 2020-12-12: qty 60, 30d supply, fill #1

## 2020-11-13 NOTE — Progress Notes (Signed)
   Subjective:   Patient ID: Gina Graves, female    DOB: Mar 29, 1955, 65 y.o.   MRN: [id number]  HPI The patient is a 65 YO female coming in having symptoms related to covid-19 4 weeks ago persistent. Has asthma concurrent and this is unpredictable since covid-19. Using albuterol more. Not in the night. With exercise a lot more.  Review of Systems  Constitutional:  Positive for fatigue.  HENT: Negative.    Eyes: Negative.   Respiratory:  Positive for cough and shortness of breath. Negative for chest tightness.   Cardiovascular:  Negative for chest pain, palpitations and leg swelling.  Gastrointestinal:  Negative for abdominal distention, abdominal pain, constipation, diarrhea, nausea and vomiting.  Musculoskeletal: Negative.   Skin: Negative.   Neurological: Negative.   Psychiatric/Behavioral: Negative.     Objective:  Physical Exam Constitutional:      Appearance: She is well-developed.  HENT:     Head: Normocephalic and atraumatic.  Cardiovascular:     Rate and Rhythm: Normal rate and regular rhythm.  Pulmonary:     Effort: Pulmonary effort is normal. No respiratory distress.     Breath sounds: Normal breath sounds. No wheezing or rales.  Abdominal:     General: Bowel sounds are normal. There is no distension.     Palpations: Abdomen is soft.     Tenderness: There is no abdominal tenderness. There is no rebound.  Musculoskeletal:     Cervical back: Normal range of motion.  Skin:    General: Skin is warm and dry.  Neurological:     Mental Status: She is alert and oriented to person, place, and time.     Coordination: Coordination normal.    Vitals:   11/13/20 1023  BP: 130/76  Pulse: 78  Resp: 18  SpO2: 97%  Weight: 172 lb (78 kg)  Height: 5\' 6"  (1.676 m)    This visit occurred during the SARS-CoV-2 public health emergency.  Safety protocols were in place, including screening questions prior to the visit, additional usage of staff PPE, and extensive cleaning  of exam room while observing appropriate contact time as indicated for disinfecting solutions.   Assessment & Plan:

## 2020-11-13 NOTE — Patient Instructions (Signed)
We have sent in breo to do 1 puff daily to help clear this up. Give it 1-2 weeks and let us know how you are doing.

## 2020-11-13 NOTE — Assessment & Plan Note (Signed)
With flare up due to covid-19. Will switch controller medication to breo 200/25 for 1 month to see if this helps. Then resume symbicort. Still use albuterol prn for symptoms and she may have an exercise induced component currently.

## 2020-11-14 ENCOUNTER — Other Ambulatory Visit: Payer: Self-pay | Admitting: Internal Medicine

## 2020-11-14 ENCOUNTER — Other Ambulatory Visit (HOSPITAL_COMMUNITY): Payer: Self-pay

## 2020-11-15 ENCOUNTER — Other Ambulatory Visit (HOSPITAL_COMMUNITY): Payer: Self-pay

## 2020-11-15 MED ORDER — DULOXETINE HCL 60 MG PO CPEP
60.0000 mg | ORAL_CAPSULE | ORAL | 0 refills | Status: DC
  Filled 2020-11-15: qty 180, 90d supply, fill #0

## 2020-11-15 MED ORDER — IBUPROFEN 800 MG PO TABS
800.0000 mg | ORAL_TABLET | Freq: Three times a day (TID) | ORAL | 1 refills | Status: DC | PRN
  Filled 2020-11-15: qty 90, 30d supply, fill #0
  Filled 2021-03-07: qty 90, 30d supply, fill #1

## 2020-12-10 ENCOUNTER — Encounter: Payer: Self-pay | Admitting: Internal Medicine

## 2020-12-10 DIAGNOSIS — J4521 Mild intermittent asthma with (acute) exacerbation: Secondary | ICD-10-CM

## 2020-12-12 ENCOUNTER — Other Ambulatory Visit (HOSPITAL_COMMUNITY): Payer: Self-pay

## 2020-12-22 ENCOUNTER — Ambulatory Visit: Payer: 59 | Attending: Internal Medicine

## 2020-12-22 DIAGNOSIS — Z23 Encounter for immunization: Secondary | ICD-10-CM

## 2020-12-22 NOTE — Progress Notes (Signed)
   Covid-19 Vaccination Clinic  Name:  Gina Graves    MRN: [id number] DOB: [birth year]  12/22/2020  [name] was observed post Covid-19 immunization for 15 minutes without incident. She was provided with Vaccine Information Sheet and instruction to access the V-Safe system.   [name] was instructed to call 911 with any severe reactions post vaccine: Difficulty breathing  Swelling of face and throat  A fast heartbeat  A bad rash all over body  Dizziness and weakness   Immunizations Administered     Name Date Dose VIS Date Route   Pfizer Covid-19 Vaccine Bivalent Booster 12/22/2020  2:43 PM 0.3 mL 09/20/2020 Intramuscular   Manufacturer: [place]   Lot: [id number]   [place]: [phone]

## 2020-12-23 ENCOUNTER — Other Ambulatory Visit (HOSPITAL_BASED_OUTPATIENT_CLINIC_OR_DEPARTMENT_OTHER): Payer: Self-pay

## 2020-12-23 ENCOUNTER — Other Ambulatory Visit: Payer: Self-pay

## 2020-12-23 MED ORDER — PFIZER COVID-19 VAC BIVALENT 30 MCG/0.3ML IM SUSP
INTRAMUSCULAR | 0 refills | Status: DC
  Filled 2020-12-23: qty 0.3, 1d supply, fill #0

## 2020-12-25 ENCOUNTER — Other Ambulatory Visit (HOSPITAL_COMMUNITY): Payer: Self-pay

## 2020-12-25 ENCOUNTER — Other Ambulatory Visit (HOSPITAL_BASED_OUTPATIENT_CLINIC_OR_DEPARTMENT_OTHER): Payer: Self-pay

## 2020-12-25 MED ORDER — VALACYCLOVIR HCL 500 MG PO TABS
500.0000 mg | ORAL_TABLET | Freq: Every day | ORAL | 0 refills | Status: DC | PRN
  Filled 2020-12-25: qty 30, 30d supply, fill #0

## 2020-12-26 ENCOUNTER — Other Ambulatory Visit (HOSPITAL_COMMUNITY): Payer: Self-pay

## 2020-12-26 ENCOUNTER — Ambulatory Visit (INDEPENDENT_AMBULATORY_CARE_PROVIDER_SITE_OTHER): Payer: 59 | Admitting: Pulmonary Disease

## 2020-12-26 ENCOUNTER — Telehealth: Payer: Self-pay | Admitting: Pulmonary Disease

## 2020-12-26 ENCOUNTER — Other Ambulatory Visit: Payer: Self-pay

## 2020-12-26 ENCOUNTER — Encounter: Payer: Self-pay | Admitting: Pulmonary Disease

## 2020-12-26 ENCOUNTER — Ambulatory Visit: Payer: 59

## 2020-12-26 VITALS — BP 120/82 | HR 76 | Ht 65.0 in | Wt 173.2 lb

## 2020-12-26 DIAGNOSIS — R0602 Shortness of breath: Secondary | ICD-10-CM

## 2020-12-26 DIAGNOSIS — R7989 Other specified abnormal findings of blood chemistry: Secondary | ICD-10-CM

## 2020-12-26 DIAGNOSIS — J4540 Moderate persistent asthma, uncomplicated: Secondary | ICD-10-CM | POA: Diagnosis not present

## 2020-12-26 DIAGNOSIS — J4521 Mild intermittent asthma with (acute) exacerbation: Secondary | ICD-10-CM

## 2020-12-26 LAB — PULMONARY FUNCTION TEST
DL/VA % pred: 115 %
DL/VA: 4.82 ml/min/mmHg/L
DLCO cor % pred: 97 %
DLCO cor: 20.22 ml/min/mmHg
DLCO unc % pred: 97 %
DLCO unc: 20.22 ml/min/mmHg
FEF 25-75 Post: 2.74 L/sec
FEF 25-75 Pre: 3.83 L/sec
FEF2575-%Change-Post: -28 %
FEF2575-%Pred-Post: 120 %
FEF2575-%Pred-Pre: 168 %
FEV1-%Change-Post: -4 %
FEV1-%Pred-Post: 107 %
FEV1-%Pred-Pre: 113 %
FEV1-Post: 2.77 L
FEV1-Pre: 2.91 L
FEV1FVC-%Change-Post: 0 %
FEV1FVC-%Pred-Pre: 111 %
FEV6-%Change-Post: -4 %
FEV6-%Pred-Post: 99 %
FEV6-%Pred-Pre: 104 %
FEV6-Post: 3.21 L
FEV6-Pre: 3.36 L
FEV6FVC-%Change-Post: 0 %
FEV6FVC-%Pred-Post: 103 %
FEV6FVC-%Pred-Pre: 103 %
FVC-%Change-Post: -4 %
FVC-%Pred-Post: 96 %
FVC-%Pred-Pre: 100 %
FVC-Post: 3.23 L
FVC-Pre: 3.36 L
Post FEV1/FVC ratio: 86 %
Post FEV6/FVC ratio: 99 %
Pre FEV1/FVC ratio: 87 %
Pre FEV6/FVC Ratio: 100 %

## 2020-12-26 LAB — D-DIMER, QUANTITATIVE: D-Dimer, Quant: 0.63 mcg/mL FEU — ABNORMAL HIGH (ref <0.50)

## 2020-12-26 MED ORDER — BREZTRI AEROSPHERE 160-9-4.8 MCG/ACT IN AERO
2.0000 | INHALATION_SPRAY | Freq: Two times a day (BID) | RESPIRATORY_TRACT | 6 refills | Status: DC
Start: 2020-12-26 — End: 2022-05-27
  Filled 2020-12-26: qty 10.7, 30d supply, fill #0
  Filled 2021-03-07: qty 10.7, 30d supply, fill #1
  Filled 2021-05-07: qty 10.7, 30d supply, fill #2
  Filled 2021-07-28: qty 10.7, 30d supply, fill #3
  Filled 2021-09-08: qty 10.7, 30d supply, fill #4
  Filled 2021-10-31: qty 10.7, 30d supply, fill #5
  Filled 2021-12-24: qty 10.7, 30d supply, fill #6

## 2020-12-26 NOTE — Telephone Encounter (Signed)
I called patient and let her know that her ddimer is elevated and that a CTA Chest PE protocol has been ordered. She expressed understanding.   Freda Jackson, MD [facility] Office: [phone]

## 2020-12-26 NOTE — Progress Notes (Signed)
PFT done today. 

## 2020-12-26 NOTE — Progress Notes (Signed)
Synopsis: Referred in December 2022 for Asthma by Pricilla Holm, MD  Subjective:   PATIENT ID: Gina Graves GENDER: female DOB: 1955-07-03, MRN: [id number]   HPI  Chief Complaint  Patient presents with   Consult    Referred by PCP for history of asthma. Was diagnosed about 20 years ago. Has had COVID twice, most recently in September 2022.    Alysa Duca is a 65 year old woman, never smokwer with hiatal hernia, GERD and asthma who is referred to pulmonary clinic for evaluation of asthma.   Patient has history of asthma in which she was diagnosed 20 years ago and she has been well controlled on ICS/LABA inhalers over many years until recently having COVID in 9/22.  Since having COVID she has had increased chest tightness, right-sided chest discomfort, daily shortness of breath and intermittent wheezing.  Her ICS/LAMA inhaler has been increased to Breo Ellipta 200-25 MCG 1 puff daily at the end of October which initially helped her symptoms but then she continued to have return of the above symptoms.  She does have seasonal allergies in which she takes Singulair and Zyrtec daily.  She does have sinus congestion and postnasal drainage in which she takes fluticasone nasal spray.  She does not report increase in sinus symptoms since having COVID.  She also has history of GERD which is no different since having COVID.  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  They recently moved into a new office that was renovated but denies any significant changes in her symptoms in the work environment compared to being at home.  She does have history of secondhand smoke exposure from her husband.  She is otherwise a never smoker.  Past Medical History:  Diagnosis Date   Asthma    recurrence @ age 81; ? childhood asthma as EIB   GERD (gastroesophageal reflux disease) 1995   hiatal hernia      Family History  Problem Relation Age of Onset   Breast  cancer Mother    Asthma Mother        asthmatic bronchitis   Heart attack Father 36       3 vessel CABG   Hashimoto's thyroiditis Sister    Autism Brother    Diabetes Paternal Grandmother    Heart failure Paternal Grandmother        in 77s   Diabetes Paternal Grandfather    Heart attack Paternal Grandfather 67   Heart attack Paternal Uncle 64   Coronary artery disease Other        paternal FH   Stroke Neg Hx    COPD Neg Hx      Social History   Socioeconomic History   Marital status: Married    Spouse name: Not on file   Number of children: Not on file   Years of education: Not on file   Highest education level: Not on file  Occupational History   Not on file  Tobacco Use   Smoking status: Never   Smokeless tobacco: Never   Tobacco comments:    second hand smoke X 25 years  Vaping Use   Vaping Use: Never used  Substance and Sexual Activity   Alcohol use: Yes    Alcohol/week: 2.0 standard drinks    Types: 2 Shots of liquor per week    Comment:  < 2/ week   Drug use: No   Sexual activity: Not on file  Other Topics Concern  Not on file  Social History Narrative   Not on file   Social Determinants of Health   Financial Resource Strain: Not on file  Food Insecurity: Not on file  Transportation Needs: Not on file  Physical Activity: Not on file  Stress: Not on file  Social Connections: Not on file  Intimate Partner Violence: Not on file     Allergies  Allergen Reactions   Bupropion Hcl     REACTION: headaches     Outpatient Medications Prior to Visit  Medication Sig Dispense Refill   Acetylcysteine (N-ACETYL-L-CYSTEINE) 600 MG CAPS Take by mouth.     albuterol (VENTOLIN HFA) 108 (90 Base) MCG/ACT inhaler Inhale 2 puffs into the lungs every 4 (four) hours as needed for wheezing or shortness of breath (wheezing or shortness of breath). 18 g 1   ALPRAZolam (XANAX) 0.5 MG tablet Take 0.5 mg by mouth as needed (anxiety).     ALPRAZolam (XANAX) 0.5 MG tablet  Take 0.5-1 tablets (0.25-0.5 mg total) by mouth 2 (two) times daily. 180 tablet 1   cetirizine (ZYRTEC) 10 MG tablet Take 10 mg by mouth daily.     Cholecalciferol (VITAMIN D3) 2000 UNITS TABS Take 2,000 Units by mouth daily as needed (vitamin supplement).      COVID-19 mRNA bivalent vaccine, Pfizer, (PFIZER COVID-19 VAC BIVALENT) injection Inject into the muscle. 0.3 mL 0   desoximetasone (TOPICORT) 0.25 % cream Apply 1 application topically 2 (two) times daily. 60 g 0   DULoxetine (CYMBALTA) 60 MG capsule Take 2 capsules (120 mg total) by mouth daily. 180 capsule 0   fluticasone (FLONASE) 50 MCG/ACT nasal spray Place 1 spray into both nostrils 2 (two) times daily as needed for allergies or rhinitis (for sinus congestion).     ibuprofen (ADVIL) 800 MG tablet Take 1 tablet (800 mg total) by mouth every 8 (eight) hours as needed. 90 tablet 1   ketoconazole (NIZORAL) 2 % shampoo Apply approximately 61ml externally once a day 120 mL 2   montelukast (SINGULAIR) 10 MG tablet TAKE 1 TABLET BY MOUTH DAILY 90 tablet 3   nystatin-triamcinolone ointment (MYCOLOG) Apply 1 application topically 2 (two) times daily. 100 g 3   traZODone (DESYREL) 50 MG tablet Take 50 mg by mouth. 1/2-1 tablet at bedtime     traZODone (DESYREL) 50 MG tablet TAKE 1 TO 2 TABLETS BY MOUTH AT BEDTIME 180 tablet 3   TURMERIC PO Take 1 tablet by mouth daily as needed (supplement).      valACYclovir (VALTREX) 500 MG tablet Take 500 mg by mouth 2 (two) times daily as needed (cold sores).     valACYclovir (VALTREX) 500 MG tablet Take 1 tablet (500 mg total) by mouth daily as needed. 30 tablet 0   fluticasone furoate-vilanterol (BREO ELLIPTA) 200-25 MCG/ACT AEPB Inhale 1 puff into the lungs daily. 60 each 11   omeprazole (PRILOSEC) 20 MG capsule TAKE 1 CAPSULE BY MOUTH 2 TIMES DAILY BEFORE A MEAL. 180 capsule 3   aluminum chloride (DRYSOL) 20 % external solution Apply 1 application topically at bedtime. 35 mL 2   benzonatate (TESSALON) 100  MG capsule Take 1 capsule (100 mg total) by mouth 3 (three) times daily as needed. 30 capsule 0   budesonide-formoterol (SYMBICORT) 160-4.5 MCG/ACT inhaler INHALE 2 PUFFS INTO THE LUNGS 2 (TWO) TIMES DAILY. 10.2 g 1   cloNIDine (CATAPRES) 0.1 MG tablet Take 1 tablet (0.1 mg total) by mouth 1-2 times daily. 60 tablet 2   cyclobenzaprine (FLEXERIL)  10 MG tablet TAKE 1 TABLET BY MOUTH EVERY EIGHT TO TWELVE HOURS AS NEEDED 30 tablet 1   DULoxetine (CYMBALTA) 60 MG capsule Take 2 capsules (120 mg total) by mouth once daily 180 capsule 0   meloxicam (MOBIC) 15 MG tablet TAKE 1 TABLET BY MOUTH ONCE A DAY WITH FOOD FOR INFLAMMATION 60 tablet 2   traZODone (DESYREL) 50 MG tablet TAKE 1 TO 2 TABLETS BY MOUTH AT BEDTIME 180 tablet 3   No facility-administered medications prior to visit.   Review of Systems  Constitutional:  Negative for chills, fever, malaise/fatigue and weight loss.  HENT:  Positive for sore throat. Negative for congestion and sinus pain.   Eyes: Negative.   Respiratory:  Positive for cough and shortness of breath. Negative for hemoptysis, sputum production and wheezing.   Cardiovascular:  Positive for chest pain. Negative for palpitations, orthopnea, claudication and leg swelling.  Gastrointestinal:  Negative for abdominal pain, heartburn, nausea and vomiting.  Genitourinary: Negative.   Musculoskeletal:  Positive for joint pain. Negative for myalgias.  Skin:  Negative for rash.  Neurological:  Positive for headaches. Negative for weakness.  Endo/Heme/Allergies: Negative.   Psychiatric/Behavioral:  Positive for depression. The patient is nervous/anxious.    Objective:   Vitals:   12/26/20 1050  BP: 120/82  Pulse: 76  SpO2: 97%  Weight: 173 lb 3.2 oz (78.6 kg)  Height: 5\' 5"  (1.651 m)   Physical Exam Constitutional:      General: She is not in acute distress.    Appearance: She is not ill-appearing.  HENT:     Head: Normocephalic and atraumatic.  Eyes:     General: No  scleral icterus.    Conjunctiva/sclera: Conjunctivae normal.     Pupils: Pupils are equal, round, and reactive to light.  Cardiovascular:     Rate and Rhythm: Normal rate and regular rhythm.     Pulses: Normal pulses.     Heart sounds: Normal heart sounds. No murmur heard. Pulmonary:     Effort: Pulmonary effort is normal.     Breath sounds: Normal breath sounds. No wheezing, rhonchi or rales.  Abdominal:     General: Bowel sounds are normal.     Palpations: Abdomen is soft.  Musculoskeletal:     Right lower leg: No edema.     Left lower leg: No edema.  Lymphadenopathy:     Cervical: No cervical adenopathy.  Skin:    General: Skin is warm and dry.  Neurological:     General: No focal deficit present.     Mental Status: She is alert.  Psychiatric:        Mood and Affect: Mood normal.        Behavior: Behavior normal.        Thought Content: Thought content normal.        Judgment: Judgment normal.   CBC    Component Value Date/Time   WBC 4.8 02/17/2020 0940   RBC 4.49 02/17/2020 0940   HGB 14.1 02/17/2020 0940   HCT 41.8 02/17/2020 0940   PLT 277.0 02/17/2020 0940   MCV 93.0 02/17/2020 0940   MCHC 33.8 02/17/2020 0940   RDW 13.1 02/17/2020 0940   LYMPHSABS 1.8 03/09/2014 0800   MONOABS 0.4 03/09/2014 0800   EOSABS 0.1 03/09/2014 0800   BASOSABS 0.0 03/09/2014 0800   Chest imaging: CXR 05/05/14 The heart size and mediastinal contours are normal. The lungs are clear. There is no pleural effusion or pneumothorax. No acute osseous findings  are identified.  PFT: PFT Results Latest Ref Rng & Units 12/26/2020  FVC-Pre L 3.36  FVC-Predicted Pre % 100  FVC-Post L 3.23  FVC-Predicted Post % 96  Pre FEV1/FVC % % 87  Post FEV1/FCV % % 86  FEV1-Pre L 2.91  FEV1-Predicted Pre % 113  FEV1-Post L 2.77  DLCO uncorrected ml/min/mmHg 20.22  DLCO UNC% % 97  DLCO corrected ml/min/mmHg 20.22  DLCO COR %Predicted % 97  DLVA Predicted % 115    Labs:  Path:  Echo  06/29/14: LVEF 60-65%. Mild LVH. Grade I diastolic dysfunction. RV size and function is normal.  Heart Catheterization:  Assessment & Plan:   Moderate persistent asthma without complication - Plan: DG Chest 2 View, Budeson-Glycopyrrol-Formoterol (BREZTRI AEROSPHERE) 160-9-4.8 MCG/ACT AERO  Shortness of breath - Plan: D-Dimer, Quantitative, CANCELED: CT Chest High Resolution  Discussion: Tawna Alwin is a 65 year old woman, never smokwer with hiatal hernia, GERD and asthma who is referred to pulmonary clinic for evaluation of asthma.   Patient has ongoing symptoms from her asthma despite increasing the ICS/LABA dose.  We will transition her to [facility] inhaler therapy and monitor for any improvement in symptoms.  Her pulmonary function testing today is otherwise normal except for the TLC is low/normal possibly concerning for mild restrictive defect.  We will check a D-dimer today for possible thromboembolic disease given her recent COVID infection.  If D-dimer is elevated we will check a CTA chest for further evaluation of pulmonary emboli along with evaluation of her airways and lung parenchyma.  If D-dimer is not elevated then we will pursue a high-resolution CT chest scan.  Follow-up in 2 months.  Freda Jackson, MD [facility] Office: [phone]   Current Outpatient Medications:    Acetylcysteine (N-ACETYL-L-CYSTEINE) 600 MG CAPS, Take by mouth., Disp: , Rfl:    albuterol (VENTOLIN HFA) 108 (90 Base) MCG/ACT inhaler, Inhale 2 puffs into the lungs every 4 (four) hours as needed for wheezing or shortness of breath (wheezing or shortness of breath)., Disp: 18 g, Rfl: 1   ALPRAZolam (XANAX) 0.5 MG tablet, Take 0.5 mg by mouth as needed (anxiety)., Disp: , Rfl:    ALPRAZolam (XANAX) 0.5 MG tablet, Take 0.5-1 tablets (0.25-0.5 mg total) by mouth 2 (two) times daily., Disp: 180 tablet, Rfl: 1   Budeson-Glycopyrrol-Formoterol (BREZTRI AEROSPHERE) 160-9-4.8 MCG/ACT  AERO, Inhale 2 puffs into the lungs in the morning and at bedtime., Disp: 10.7 g, Rfl: 6   cetirizine (ZYRTEC) 10 MG tablet, Take 10 mg by mouth daily., Disp: , Rfl:    Cholecalciferol (VITAMIN D3) 2000 UNITS TABS, Take 2,000 Units by mouth daily as needed (vitamin supplement). , Disp: , Rfl:    COVID-19 mRNA bivalent vaccine, Pfizer, (PFIZER COVID-19 VAC BIVALENT) injection, Inject into the muscle., Disp: 0.3 mL, Rfl: 0   desoximetasone (TOPICORT) 0.25 % cream, Apply 1 application topically 2 (two) times daily., Disp: 60 g, Rfl: 0   DULoxetine (CYMBALTA) 60 MG capsule, Take 2 capsules (120 mg total) by mouth daily., Disp: 180 capsule, Rfl: 0   fluticasone (FLONASE) 50 MCG/ACT nasal spray, Place 1 spray into both nostrils 2 (two) times daily as needed for allergies or rhinitis (for sinus congestion)., Disp: , Rfl:    ibuprofen (ADVIL) 800 MG tablet, Take 1 tablet (800 mg total) by mouth every 8 (eight) hours as needed., Disp: 90 tablet, Rfl: 1   ketoconazole (NIZORAL) 2 % shampoo, Apply approximately 24ml externally once a day, Disp: 120 mL, Rfl:  2   montelukast (SINGULAIR) 10 MG tablet, TAKE 1 TABLET BY MOUTH DAILY, Disp: 90 tablet, Rfl: 3   nystatin-triamcinolone ointment (MYCOLOG), Apply 1 application topically 2 (two) times daily., Disp: 100 g, Rfl: 3   traZODone (DESYREL) 50 MG tablet, Take 50 mg by mouth. 1/2-1 tablet at bedtime, Disp: , Rfl:    traZODone (DESYREL) 50 MG tablet, TAKE 1 TO 2 TABLETS BY MOUTH AT BEDTIME, Disp: 180 tablet, Rfl: 3   TURMERIC PO, Take 1 tablet by mouth daily as needed (supplement). , Disp: , Rfl:    valACYclovir (VALTREX) 500 MG tablet, Take 500 mg by mouth 2 (two) times daily as needed (cold sores)., Disp: , Rfl:    valACYclovir (VALTREX) 500 MG tablet, Take 1 tablet (500 mg total) by mouth daily as needed., Disp: 30 tablet, Rfl: 0   omeprazole (PRILOSEC) 20 MG capsule, TAKE 1 CAPSULE BY MOUTH 2 TIMES DAILY BEFORE A MEAL., Disp: 180 capsule, Rfl: 3

## 2020-12-26 NOTE — Patient Instructions (Signed)
We will check a ddimer today for possible blood clot. If elevated we will order CT Chest with contrast. If negative we will order CT chest without contrast.   Start breztri inhaler 2 puffs twice daily - rinse mouth out after each use  Stop using Breo Ellipta while on Breztri inhaler.  Continue as needed albuterol   Your breathing tests are overall normal, except for possible mild restrictive defect based on the low TLC (total lung capacity)

## 2020-12-27 ENCOUNTER — Ambulatory Visit
Admission: RE | Admit: 2020-12-27 | Discharge: 2020-12-27 | Disposition: A | Discharge status: Home / Self Care | Payer: 59 | Source: Ambulatory Visit | Attending: Pulmonary Disease | Admitting: Pulmonary Disease

## 2020-12-27 DIAGNOSIS — R918 Other nonspecific abnormal finding of lung field: Secondary | ICD-10-CM | POA: Diagnosis not present

## 2020-12-27 DIAGNOSIS — R0602 Shortness of breath: Secondary | ICD-10-CM | POA: Diagnosis not present

## 2020-12-27 DIAGNOSIS — I700 Atherosclerosis of aorta: Secondary | ICD-10-CM | POA: Diagnosis not present

## 2020-12-27 DIAGNOSIS — R7989 Other specified abnormal findings of blood chemistry: Secondary | ICD-10-CM

## 2020-12-27 MED ORDER — IOPAMIDOL (ISOVUE-370) INJECTION 76%
75.0000 mL | Freq: Once | INTRAVENOUS | Status: AC | PRN
  Administered 2020-12-27: 75 mL via INTRAVENOUS

## 2021-01-03 DIAGNOSIS — Z01419 Encounter for gynecological examination (general) (routine) without abnormal findings: Secondary | ICD-10-CM | POA: Diagnosis not present

## 2021-01-03 DIAGNOSIS — Z6828 Body mass index (BMI) 28.0-28.9, adult: Secondary | ICD-10-CM | POA: Diagnosis not present

## 2021-01-03 DIAGNOSIS — Z1231 Encounter for screening mammogram for malignant neoplasm of breast: Secondary | ICD-10-CM | POA: Diagnosis not present

## 2021-01-07 ENCOUNTER — Encounter (INDEPENDENT_AMBULATORY_CARE_PROVIDER_SITE_OTHER): Payer: Self-pay

## 2021-01-23 ENCOUNTER — Other Ambulatory Visit (HOSPITAL_COMMUNITY): Payer: Self-pay

## 2021-01-23 MED ORDER — VALACYCLOVIR HCL 500 MG PO TABS
500.0000 mg | ORAL_TABLET | Freq: Every day | ORAL | 6 refills | Status: DC | PRN
  Filled 2021-01-23: qty 30, 30d supply, fill #0

## 2021-01-23 MED FILL — Trazodone HCl Tab 50 MG: ORAL | 90 days supply | Qty: 180 | Fill #2 | Status: AC

## 2021-01-26 DIAGNOSIS — M65332 Trigger finger, left middle finger: Secondary | ICD-10-CM | POA: Diagnosis not present

## 2021-02-12 ENCOUNTER — Other Ambulatory Visit: Payer: Self-pay | Admitting: Internal Medicine

## 2021-02-12 ENCOUNTER — Other Ambulatory Visit (HOSPITAL_COMMUNITY): Payer: Self-pay

## 2021-02-12 MED ORDER — MONTELUKAST SODIUM 10 MG PO TABS
10.0000 mg | ORAL_TABLET | Freq: Every day | ORAL | 3 refills | Status: DC
  Filled 2021-02-12: qty 90, 90d supply, fill #0
  Filled 2021-05-14: qty 90, 90d supply, fill #1
  Filled 2021-08-13: qty 90, 90d supply, fill #2
  Filled 2021-11-10: qty 90, 90d supply, fill #3

## 2021-02-13 ENCOUNTER — Other Ambulatory Visit (HOSPITAL_COMMUNITY): Payer: Self-pay

## 2021-02-13 MED ORDER — ALPRAZOLAM 0.5 MG PO TABS
ORAL_TABLET | ORAL | 1 refills | Status: DC
  Filled 2021-02-13: qty 60, 30d supply, fill #0

## 2021-02-13 MED ORDER — DULOXETINE HCL 60 MG PO CPEP
120.0000 mg | ORAL_CAPSULE | Freq: Every day | ORAL | 0 refills | Status: DC
  Filled 2021-02-13: qty 60, 30d supply, fill #0

## 2021-02-14 ENCOUNTER — Other Ambulatory Visit (HOSPITAL_COMMUNITY): Payer: Self-pay

## 2021-02-15 ENCOUNTER — Other Ambulatory Visit (HOSPITAL_COMMUNITY): Payer: Self-pay

## 2021-02-15 ENCOUNTER — Encounter: Payer: 59 | Admitting: Internal Medicine

## 2021-02-16 ENCOUNTER — Other Ambulatory Visit (HOSPITAL_COMMUNITY): Payer: Self-pay

## 2021-02-16 MED ORDER — ALPRAZOLAM 0.5 MG PO TABS
0.2500 mg | ORAL_TABLET | Freq: Two times a day (BID) | ORAL | 1 refills | Status: DC | PRN
Start: 2021-02-15 — End: 2022-05-30
  Filled 2021-02-16: qty 60, 30d supply, fill #0

## 2021-02-20 ENCOUNTER — Ambulatory Visit (INDEPENDENT_AMBULATORY_CARE_PROVIDER_SITE_OTHER): Payer: 59 | Admitting: Pulmonary Disease

## 2021-02-20 ENCOUNTER — Encounter: Payer: Self-pay | Admitting: Pulmonary Disease

## 2021-02-20 ENCOUNTER — Other Ambulatory Visit: Payer: Self-pay

## 2021-02-20 VITALS — BP 124/72 | HR 79 | Ht 65.0 in | Wt 175.6 lb

## 2021-02-20 DIAGNOSIS — J4540 Moderate persistent asthma, uncomplicated: Secondary | ICD-10-CM

## 2021-02-20 NOTE — Progress Notes (Signed)
Synopsis: Referred in December 2022 for Asthma by Pricilla Holm, MD  Subjective:   PATIENT ID: Gina Graves GENDER: female DOB: 02-10-1955, MRN: [id number]   HPI  Chief Complaint  Patient presents with   Follow-up    8wk f/u, states she has been doing well since last visit.    Gina Graves is a 66 year old woman, never smokwer with hiatal hernia, GERD and asthma who returns to pulmonary clinic for asthma.   She reports improvement in her breathing since starting breztri inhaler 2 puffs twice daily since last month. She reports a 3 day episode of fatigue, shortness of breath and chest discomfort that improved spontaneously.   CTA Chest 12/27/20 was negative for pulmonary emboli and normal lung parenchyma.   OV 12/26/20 Patient has history of asthma in which she was diagnosed 20 years ago and she has been well controlled on ICS/LABA inhalers over many years until recently having Gina Graves in 9/22.  Since having COVID she has had increased chest tightness, right-sided chest discomfort, daily shortness of breath and intermittent wheezing.  Her ICS/LAMA inhaler has been increased to Breo Ellipta 200-25 MCG 1 puff daily at the end of October which initially helped her symptoms but then she continued to have return of the above symptoms.  She does have seasonal allergies in which she takes Singulair and Zyrtec daily.  She does have sinus congestion and postnasal drainage in which she takes fluticasone nasal spray.  She does not report increase in sinus symptoms since having COVID.  She also has history of GERD which is no different since having COVID.  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  They recently moved into a new office that was renovated but denies any significant changes in her symptoms in the work environment compared to being at home.  She does have history of secondhand smoke exposure from her husband.  She is otherwise a never  smoker.  Past Medical History:  Diagnosis Date   Asthma    recurrence @ age 55; ? childhood asthma as EIB   GERD (gastroesophageal reflux disease) 1995   hiatal hernia      Family History  Problem Relation Age of Onset   Breast cancer Mother    Asthma Mother        asthmatic bronchitis   Heart attack Father 94       3 vessel CABG   Hashimoto's thyroiditis Sister    Autism Brother    Diabetes Paternal Grandmother    Heart failure Paternal Grandmother        in 63s   Diabetes Paternal Grandfather    Heart attack Paternal Grandfather 58   Heart attack Paternal Uncle 56   Coronary artery disease Other        paternal FH   Stroke Neg Hx    COPD Neg Hx      Social History   Socioeconomic History   Marital status: Married    Spouse name: Not on file   Number of children: Not on file   Years of education: Not on file   Highest education level: Not on file  Occupational History   Not on file  Tobacco Use   Smoking status: Never   Smokeless tobacco: Never   Tobacco comments:    second hand smoke X 25 years  Vaping Use   Vaping Use: Never used  Substance and Sexual Activity   Alcohol use: Yes  Alcohol/week: 2.0 standard drinks    Types: 2 Shots of liquor per week    Comment:  < 2/ week   Drug use: No   Sexual activity: Not on file  Other Topics Concern   Not on file  Social History Narrative   Not on file   Social Determinants of Health   Financial Resource Strain: Not on file  Food Insecurity: Not on file  Transportation Needs: Not on file  Physical Activity: Not on file  Stress: Not on file  Social Connections: Not on file  Intimate Partner Violence: Not on file     Allergies  Allergen Reactions   Bupropion Hcl     REACTION: headaches     Outpatient Medications Prior to Visit  Medication Sig Dispense Refill   Acetylcysteine (N-ACETYL-L-CYSTEINE) 600 MG CAPS Take by mouth.     albuterol (VENTOLIN HFA) 108 (90 Base) MCG/ACT inhaler Inhale 2 puffs  into the lungs every 4 (four) hours as needed for wheezing or shortness of breath (wheezing or shortness of breath). 18 g 1   ALPRAZolam (XANAX) 0.5 MG tablet Take 0.5 mg by mouth as needed (anxiety).     ALPRAZolam (XANAX) 0.5 MG tablet Take 0.5-1 tablets (0.25-0.5 mg total) by mouth 2 (two) times daily. 180 tablet 1   ALPRAZolam (XANAX) 0.5 MG tablet Take 1/2  to 1 tablet by mouth 2 times a day 60 tablet 1   ALPRAZolam (XANAX) 0.5 MG tablet Take 0.5-1 tablets (0.25-0.5 mg total) by mouth 2 (two) times daily as needed for anxiety 60 tablet 1   Budeson-Glycopyrrol-Formoterol (BREZTRI AEROSPHERE) 160-9-4.8 MCG/ACT AERO Inhale 2 puffs into the lungs in the morning and at bedtime. 10.7 g 6   cetirizine (ZYRTEC) 10 MG tablet Take 10 mg by mouth daily.     Cholecalciferol (VITAMIN D3) 2000 UNITS TABS Take 2,000 Units by mouth daily as needed (vitamin supplement).      COVID-19 mRNA bivalent vaccine, Pfizer, (PFIZER COVID-19 VAC BIVALENT) injection Inject into the muscle. 0.3 mL 0   desoximetasone (TOPICORT) 0.25 % cream Apply 1 application topically 2 (two) times daily. 60 g 0   DULoxetine (CYMBALTA) 60 MG capsule Take 2 capsules (120 mg total) by mouth daily. 60 capsule 0   fluticasone (FLONASE) 50 MCG/ACT nasal spray Place 1 spray into both nostrils 2 (two) times daily as needed for allergies or rhinitis (for sinus congestion).     ibuprofen (ADVIL) 800 MG tablet Take 1 tablet (800 mg total) by mouth every 8 (eight) hours as needed. 90 tablet 1   ketoconazole (NIZORAL) 2 % shampoo Apply approximately 24ml externally once a day 120 mL 2   montelukast (SINGULAIR) 10 MG tablet Take 1 tablet (10 mg total) by mouth daily. 90 tablet 3   nystatin-triamcinolone ointment (MYCOLOG) Apply 1 application topically 2 (two) times daily. 100 g 3   traZODone (DESYREL) 50 MG tablet Take 50 mg by mouth. 1/2-1 tablet at bedtime     traZODone (DESYREL) 50 MG tablet TAKE 1 TO 2 TABLETS BY MOUTH AT BEDTIME 180 tablet 3    TURMERIC PO Take 1 tablet by mouth daily as needed (supplement).      valACYclovir (VALTREX) 500 MG tablet Take 500 mg by mouth 2 (two) times daily as needed (cold sores).     valACYclovir (VALTREX) 500 MG tablet Take 1 tablet (500 mg total) by mouth daily as needed. 30 tablet 6   omeprazole (PRILOSEC) 20 MG capsule TAKE 1 CAPSULE BY MOUTH 2  TIMES DAILY BEFORE A MEAL. 180 capsule 3   No facility-administered medications prior to visit.   Review of Systems  Constitutional:  Negative for chills, fever, malaise/fatigue and weight loss.  HENT:  Negative for congestion, sinus pain and sore throat.   Eyes: Negative.   Respiratory:  Negative for cough, hemoptysis, sputum production, shortness of breath and wheezing.   Cardiovascular:  Negative for chest pain, palpitations, orthopnea, claudication and leg swelling.  Gastrointestinal:  Negative for abdominal pain, heartburn, nausea and vomiting.  Genitourinary: Negative.   Musculoskeletal:  Negative for joint pain and myalgias.  Skin:  Negative for rash.  Neurological:  Negative for weakness and headaches.  Endo/Heme/Allergies: Negative.    Objective:   Vitals:   02/20/21 1328  BP: 124/72  Pulse: 79  SpO2: 99%  Weight: 175 lb 9.6 oz (79.7 kg)  Height: 5\' 5"  (1.651 m)   Physical Exam Constitutional:      General: She is not in acute distress.    Appearance: She is not ill-appearing.  HENT:     Head: Normocephalic and atraumatic.  Eyes:     General: No scleral icterus.    Conjunctiva/sclera: Conjunctivae normal.  Cardiovascular:     Rate and Rhythm: Normal rate and regular rhythm.     Pulses: Normal pulses.     Heart sounds: Normal heart sounds. No murmur heard. Pulmonary:     Effort: Pulmonary effort is normal.     Breath sounds: Normal breath sounds. No wheezing, rhonchi or rales.  Musculoskeletal:     Right lower leg: No edema.     Left lower leg: No edema.  Skin:    General: Skin is warm and dry.  Neurological:      General: No focal deficit present.     Mental Status: She is alert.  Psychiatric:        Mood and Affect: Mood normal.        Behavior: Behavior normal.        Thought Content: Thought content normal.        Judgment: Judgment normal.   CBC    Component Value Date/Time   WBC 4.8 02/17/2020 0940   RBC 4.49 02/17/2020 0940   HGB 14.1 02/17/2020 0940   HCT 41.8 02/17/2020 0940   PLT 277.0 02/17/2020 0940   MCV 93.0 02/17/2020 0940   MCHC 33.8 02/17/2020 0940   RDW 13.1 02/17/2020 0940   LYMPHSABS 1.8 03/09/2014 0800   MONOABS 0.4 03/09/2014 0800   EOSABS 0.1 03/09/2014 0800   BASOSABS 0.0 03/09/2014 0800   Chest imaging: CTA Chest 12/27/20 No pulmonary emboli. Normal examination with exception of mild atherosclerotic calcification of the thoracic aorta. Maximal diameter of the ascending aorta 3.9 cm. Follow-up recommendations do not begin until a diameter of 4 cm.  CXR 05/05/14 The heart size and mediastinal contours are normal. The lungs are clear. There is no pleural effusion or pneumothorax. No acute osseous findings are identified.  PFT: PFT Results Latest Ref Rng & Units 12/26/2020  FVC-Pre L 3.36  FVC-Predicted Pre % 100  FVC-Post L 3.23  FVC-Predicted Post % 96  Pre FEV1/FVC % % 87  Post FEV1/FCV % % 86  FEV1-Pre L 2.91  FEV1-Predicted Pre % 113  FEV1-Post L 2.77  DLCO uncorrected ml/min/mmHg 20.22  DLCO UNC% % 97  DLCO corrected ml/min/mmHg 20.22  DLCO COR %Predicted % 97  DLVA Predicted % 115  PFTs 12/2020: within normal limits  Labs:  Path:  Echo 06/29/14: LVEF  60-65%. Mild LVH. Grade I diastolic dysfunction. RV size and function is normal.  Heart Catheterization:  Assessment & Plan:   Moderate persistent asthma without complication  Discussion: Ellenor Wisniewski is a 66 year old woman, never smokwer with hiatal hernia, GERD and asthma who returns to pulmonary clinic for asthma.   She has done well on ICS/LAMA/LABA inhaler therapy and is to  continue on breztri inhaler 2 puffs twice daily.   Follow-up in 8 months.  Freda Jackson, MD [facility] Office: [phone]   Current Outpatient Medications:    Acetylcysteine (N-ACETYL-L-CYSTEINE) 600 MG CAPS, Take by mouth., Disp: , Rfl:    albuterol (VENTOLIN HFA) 108 (90 Base) MCG/ACT inhaler, Inhale 2 puffs into the lungs every 4 (four) hours as needed for wheezing or shortness of breath (wheezing or shortness of breath)., Disp: 18 g, Rfl: 1   ALPRAZolam (XANAX) 0.5 MG tablet, Take 0.5 mg by mouth as needed (anxiety)., Disp: , Rfl:    ALPRAZolam (XANAX) 0.5 MG tablet, Take 0.5-1 tablets (0.25-0.5 mg total) by mouth 2 (two) times daily., Disp: 180 tablet, Rfl: 1   ALPRAZolam (XANAX) 0.5 MG tablet, Take 1/2  to 1 tablet by mouth 2 times a day, Disp: 60 tablet, Rfl: 1   ALPRAZolam (XANAX) 0.5 MG tablet, Take 0.5-1 tablets (0.25-0.5 mg total) by mouth 2 (two) times daily as needed for anxiety, Disp: 60 tablet, Rfl: 1   Budeson-Glycopyrrol-Formoterol (BREZTRI AEROSPHERE) 160-9-4.8 MCG/ACT AERO, Inhale 2 puffs into the lungs in the morning and at bedtime., Disp: 10.7 g, Rfl: 6   cetirizine (ZYRTEC) 10 MG tablet, Take 10 mg by mouth daily., Disp: , Rfl:    Cholecalciferol (VITAMIN D3) 2000 UNITS TABS, Take 2,000 Units by mouth daily as needed (vitamin supplement). , Disp: , Rfl:    COVID-19 mRNA bivalent vaccine, Pfizer, (PFIZER COVID-19 VAC BIVALENT) injection, Inject into the muscle., Disp: 0.3 mL, Rfl: 0   desoximetasone (TOPICORT) 0.25 % cream, Apply 1 application topically 2 (two) times daily., Disp: 60 g, Rfl: 0   DULoxetine (CYMBALTA) 60 MG capsule, Take 2 capsules (120 mg total) by mouth daily., Disp: 60 capsule, Rfl: 0   fluticasone (FLONASE) 50 MCG/ACT nasal spray, Place 1 spray into both nostrils 2 (two) times daily as needed for allergies or rhinitis (for sinus congestion)., Disp: , Rfl:    ibuprofen (ADVIL) 800 MG tablet, Take 1 tablet (800 mg total) by  mouth every 8 (eight) hours as needed., Disp: 90 tablet, Rfl: 1   ketoconazole (NIZORAL) 2 % shampoo, Apply approximately 58ml externally once a day, Disp: 120 mL, Rfl: 2   montelukast (SINGULAIR) 10 MG tablet, Take 1 tablet (10 mg total) by mouth daily., Disp: 90 tablet, Rfl: 3   nystatin-triamcinolone ointment (MYCOLOG), Apply 1 application topically 2 (two) times daily., Disp: 100 g, Rfl: 3   traZODone (DESYREL) 50 MG tablet, Take 50 mg by mouth. 1/2-1 tablet at bedtime, Disp: , Rfl:    traZODone (DESYREL) 50 MG tablet, TAKE 1 TO 2 TABLETS BY MOUTH AT BEDTIME, Disp: 180 tablet, Rfl: 3   TURMERIC PO, Take 1 tablet by mouth daily as needed (supplement). , Disp: , Rfl:    valACYclovir (VALTREX) 500 MG tablet, Take 500 mg by mouth 2 (two) times daily as needed (cold sores)., Disp: , Rfl:    valACYclovir (VALTREX) 500 MG tablet, Take 1 tablet (500 mg total) by mouth daily as needed., Disp: 30 tablet, Rfl: 6   omeprazole (PRILOSEC) 20 MG capsule,  TAKE 1 CAPSULE BY MOUTH 2 TIMES DAILY BEFORE A MEAL., Disp: 180 capsule, Rfl: 3

## 2021-02-20 NOTE — Patient Instructions (Addendum)
Continue on breztri inhaler 2 puffs twice daily.  Use as needed albuterol 1-2 puffs every 4-6 hours as needed.

## 2021-02-21 ENCOUNTER — Ambulatory Visit (INDEPENDENT_AMBULATORY_CARE_PROVIDER_SITE_OTHER): Payer: 59 | Admitting: Internal Medicine

## 2021-02-21 ENCOUNTER — Encounter: Payer: Self-pay | Admitting: Internal Medicine

## 2021-02-21 VITALS — BP 112/68 | HR 75 | Temp 98.5°F | Ht 65.0 in | Wt 173.8 lb

## 2021-02-21 DIAGNOSIS — E782 Mixed hyperlipidemia: Secondary | ICD-10-CM | POA: Diagnosis not present

## 2021-02-21 DIAGNOSIS — Z1211 Encounter for screening for malignant neoplasm of colon: Secondary | ICD-10-CM

## 2021-02-21 DIAGNOSIS — M8580 Other specified disorders of bone density and structure, unspecified site: Secondary | ICD-10-CM | POA: Diagnosis not present

## 2021-02-21 DIAGNOSIS — J4520 Mild intermittent asthma, uncomplicated: Secondary | ICD-10-CM

## 2021-02-21 DIAGNOSIS — Z8249 Family history of ischemic heart disease and other diseases of the circulatory system: Secondary | ICD-10-CM | POA: Diagnosis not present

## 2021-02-21 DIAGNOSIS — F3342 Major depressive disorder, recurrent, in full remission: Secondary | ICD-10-CM | POA: Diagnosis not present

## 2021-02-21 DIAGNOSIS — Z0000 Encounter for general adult medical examination without abnormal findings: Secondary | ICD-10-CM | POA: Diagnosis not present

## 2021-02-21 LAB — HEMOGLOBIN A1C: Hgb A1c MFr Bld: 5.7 % (ref 4.6–6.5)

## 2021-02-21 LAB — LIPID PANEL
Cholesterol: 174 mg/dL (ref 0–200)
HDL: 55.6 mg/dL (ref >39.00)
LDL Cholesterol: 100 mg/dL — ABNORMAL HIGH (ref 0–99)
NonHDL: 118.33
Total CHOL/HDL Ratio: 3
Triglycerides: 90 mg/dL (ref 0.0–149.0)
VLDL: 18 mg/dL (ref 0.0–40.0)

## 2021-02-21 LAB — COMPREHENSIVE METABOLIC PANEL
ALT: 11 U/L (ref 0–35)
AST: 13 U/L (ref 0–37)
Albumin: 4.4 g/dL (ref 3.5–5.2)
Alkaline Phosphatase: 46 U/L (ref 39–117)
BUN: 14 mg/dL (ref 6–23)
CO2: 32 mEq/L (ref 19–32)
Calcium: 10.1 mg/dL (ref 8.4–10.5)
Chloride: 105 mEq/L (ref 96–112)
Creatinine, Ser: 0.83 mg/dL (ref 0.40–1.20)
GFR: 74.81 mL/min (ref >60.00)
Glucose, Bld: 102 mg/dL — ABNORMAL HIGH (ref 70–99)
Potassium: 4 mEq/L (ref 3.5–5.1)
Sodium: 141 mEq/L (ref 135–145)
Total Bilirubin: 0.6 mg/dL (ref 0.2–1.2)
Total Protein: 6.9 g/dL (ref 6.0–8.3)

## 2021-02-21 LAB — CBC
HCT: 41.2 % (ref 36.0–46.0)
Hemoglobin: 13.6 g/dL (ref 12.0–15.0)
MCHC: 33.1 g/dL (ref 30.0–36.0)
MCV: 94.7 fl (ref 78.0–100.0)
Platelets: 291 10*3/uL (ref 150.0–400.0)
RBC: 4.35 Mil/uL (ref 3.87–5.11)
RDW: 13.5 % (ref 11.5–15.5)
WBC: 4.1 10*3/uL (ref 4.0–10.5)

## 2021-02-21 LAB — TSH: TSH: 0.97 u[IU]/mL (ref 0.35–5.50)

## 2021-02-21 NOTE — Assessment & Plan Note (Signed)
Taking cymbalta and doing well, rare xanax. Uses trazodone for sleep.

## 2021-02-21 NOTE — Assessment & Plan Note (Signed)
Sees pulmonary and overall stable on regimen.

## 2021-02-21 NOTE — Assessment & Plan Note (Signed)
Flu shot up to date. Covid-19 up to date. Shingrix complete. Tetanus up to date. Cologuard ordered due. Mammogram up to date with gyn, pap smear up to date dexa getting this year. Counseled about sun safety and mole surveillance. Counseled about the dangers of distracted driving. Given 10 year screening recommendations.

## 2021-02-21 NOTE — Assessment & Plan Note (Signed)
Ordered CT calcium score for risk stratification and decide if we need to be more aggressive with cholesterol treatment.

## 2021-02-21 NOTE — Patient Instructions (Signed)
Your EKG looks good. We will get the cologuard and calcium score for the heart.

## 2021-02-21 NOTE — Assessment & Plan Note (Signed)
Getting DEXA soon.

## 2021-02-21 NOTE — Progress Notes (Signed)
° °  Subjective:   Patient ID: Gina Graves, female    DOB: 1955/10/18, 66 y.o.   MRN: [id number]  HPI The patient is a 66 YO female coming in for physical.   PMH, [place], social history reviewed and updated  Review of Systems  Constitutional: Negative.   HENT: Negative.    Eyes: Negative.   Respiratory:  Negative for cough, chest tightness and shortness of breath.   Cardiovascular:  Negative for chest pain, palpitations and leg swelling.  Gastrointestinal:  Negative for abdominal distention, abdominal pain, constipation, diarrhea, nausea and vomiting.  Musculoskeletal: Negative.   Skin: Negative.   Neurological: Negative.   Psychiatric/Behavioral: Negative.     Objective:  Physical Exam Constitutional:      Appearance: She is well-developed.  HENT:     Head: Normocephalic and atraumatic.  Cardiovascular:     Rate and Rhythm: Normal rate and regular rhythm.  Pulmonary:     Effort: Pulmonary effort is normal. No respiratory distress.     Breath sounds: Normal breath sounds. No wheezing or rales.  Abdominal:     General: Bowel sounds are normal. There is no distension.     Palpations: Abdomen is soft.     Tenderness: There is no abdominal tenderness. There is no rebound.  Musculoskeletal:     Cervical back: Normal range of motion.  Skin:    General: Skin is warm and dry.  Neurological:     Mental Status: She is alert and oriented to person, place, and time.     Coordination: Coordination normal.    Vitals:   02/21/21 0810  BP: 112/68  Pulse: 75  Temp: 98.5 F (36.9 C)  TempSrc: Oral  SpO2: 99%  Weight: 173 lb 12.8 oz (78.8 kg)  Height: 5\' 5"  (1.651 m)   EKG: Rate 73, axis normal, interval normal, sinus, no st or t wave changes, no significant change compared to prior 2022  This visit occurred during the SARS-CoV-2 public health emergency.  Safety protocols were in place, including screening questions prior to the visit, additional usage of staff PPE, and  extensive cleaning of exam room while observing appropriate contact time as indicated for disinfecting solutions.   Assessment & Plan:

## 2021-02-21 NOTE — Assessment & Plan Note (Signed)
Checking lipid panel and adjust as needed. Diet controlled.  

## 2021-03-07 ENCOUNTER — Other Ambulatory Visit (HOSPITAL_COMMUNITY): Payer: Self-pay

## 2021-03-08 DIAGNOSIS — F341 Dysthymic disorder: Secondary | ICD-10-CM | POA: Diagnosis not present

## 2021-03-09 ENCOUNTER — Other Ambulatory Visit (HOSPITAL_COMMUNITY): Payer: Self-pay

## 2021-03-09 MED ORDER — ALPRAZOLAM 0.5 MG PO TABS
0.2500 mg | ORAL_TABLET | Freq: Two times a day (BID) | ORAL | 1 refills | Status: DC | PRN
Start: 2021-03-08 — End: 2022-05-30
  Filled 2021-03-09 – 2021-06-04 (×2): qty 180, 90d supply, fill #0

## 2021-03-09 MED ORDER — DULOXETINE HCL 60 MG PO CPEP
120.0000 mg | ORAL_CAPSULE | Freq: Every day | ORAL | 3 refills | Status: DC
  Filled 2021-03-09: qty 180, 90d supply, fill #0
  Filled 2021-06-11: qty 180, 90d supply, fill #1
  Filled 2021-09-08: qty 180, 90d supply, fill #2
  Filled 2021-12-10: qty 180, 90d supply, fill #3

## 2021-03-09 MED ORDER — TRAZODONE HCL 50 MG PO TABS
50.0000 mg | ORAL_TABLET | Freq: Every evening | ORAL | 3 refills | Status: DC
  Filled 2021-03-09 – 2021-05-21 (×2): qty 180, 90d supply, fill #0
  Filled 2021-09-08: qty 180, 90d supply, fill #1
  Filled 2021-12-17: qty 180, 90d supply, fill #2

## 2021-03-13 DIAGNOSIS — Z1211 Encounter for screening for malignant neoplasm of colon: Secondary | ICD-10-CM | POA: Diagnosis not present

## 2021-03-14 DIAGNOSIS — Z13820 Encounter for screening for osteoporosis: Secondary | ICD-10-CM | POA: Diagnosis not present

## 2021-03-16 ENCOUNTER — Other Ambulatory Visit (HOSPITAL_COMMUNITY): Payer: Self-pay

## 2021-03-16 DIAGNOSIS — M5412 Radiculopathy, cervical region: Secondary | ICD-10-CM | POA: Diagnosis not present

## 2021-03-16 MED ORDER — MELOXICAM 15 MG PO TABS
15.0000 mg | ORAL_TABLET | Freq: Every day | ORAL | 0 refills | Status: DC
Start: 2021-03-16 — End: 2021-10-18
  Filled 2021-03-16: qty 40, 40d supply, fill #0

## 2021-03-16 MED ORDER — CYCLOBENZAPRINE HCL 10 MG PO TABS
10.0000 mg | ORAL_TABLET | Freq: Three times a day (TID) | ORAL | 0 refills | Status: DC | PRN
  Filled 2021-03-16: qty 45, 15d supply, fill #0

## 2021-03-21 LAB — COLOGUARD: COLOGUARD: NEGATIVE

## 2021-03-22 DIAGNOSIS — M9901 Segmental and somatic dysfunction of cervical region: Secondary | ICD-10-CM | POA: Diagnosis not present

## 2021-03-22 DIAGNOSIS — M546 Pain in thoracic spine: Secondary | ICD-10-CM | POA: Diagnosis not present

## 2021-03-22 DIAGNOSIS — M6240 Contracture of muscle, unspecified site: Secondary | ICD-10-CM | POA: Diagnosis not present

## 2021-03-22 DIAGNOSIS — M531 Cervicobrachial syndrome: Secondary | ICD-10-CM | POA: Diagnosis not present

## 2021-03-22 DIAGNOSIS — M47812 Spondylosis without myelopathy or radiculopathy, cervical region: Secondary | ICD-10-CM | POA: Diagnosis not present

## 2021-03-22 DIAGNOSIS — J329 Chronic sinusitis, unspecified: Secondary | ICD-10-CM | POA: Diagnosis not present

## 2021-03-22 DIAGNOSIS — M9902 Segmental and somatic dysfunction of thoracic region: Secondary | ICD-10-CM | POA: Diagnosis not present

## 2021-03-22 DIAGNOSIS — M5032 Other cervical disc degeneration, mid-cervical region, unspecified level: Secondary | ICD-10-CM | POA: Diagnosis not present

## 2021-03-26 DIAGNOSIS — M5032 Other cervical disc degeneration, mid-cervical region, unspecified level: Secondary | ICD-10-CM | POA: Diagnosis not present

## 2021-03-26 DIAGNOSIS — M9902 Segmental and somatic dysfunction of thoracic region: Secondary | ICD-10-CM | POA: Diagnosis not present

## 2021-03-26 DIAGNOSIS — J329 Chronic sinusitis, unspecified: Secondary | ICD-10-CM | POA: Diagnosis not present

## 2021-03-26 DIAGNOSIS — M531 Cervicobrachial syndrome: Secondary | ICD-10-CM | POA: Diagnosis not present

## 2021-03-26 DIAGNOSIS — M6240 Contracture of muscle, unspecified site: Secondary | ICD-10-CM | POA: Diagnosis not present

## 2021-03-26 DIAGNOSIS — M9901 Segmental and somatic dysfunction of cervical region: Secondary | ICD-10-CM | POA: Diagnosis not present

## 2021-03-26 DIAGNOSIS — M546 Pain in thoracic spine: Secondary | ICD-10-CM | POA: Diagnosis not present

## 2021-03-26 DIAGNOSIS — M47812 Spondylosis without myelopathy or radiculopathy, cervical region: Secondary | ICD-10-CM | POA: Diagnosis not present

## 2021-03-29 DIAGNOSIS — M6240 Contracture of muscle, unspecified site: Secondary | ICD-10-CM | POA: Diagnosis not present

## 2021-03-29 DIAGNOSIS — M9901 Segmental and somatic dysfunction of cervical region: Secondary | ICD-10-CM | POA: Diagnosis not present

## 2021-03-29 DIAGNOSIS — M5032 Other cervical disc degeneration, mid-cervical region, unspecified level: Secondary | ICD-10-CM | POA: Diagnosis not present

## 2021-03-29 DIAGNOSIS — M546 Pain in thoracic spine: Secondary | ICD-10-CM | POA: Diagnosis not present

## 2021-03-29 DIAGNOSIS — J329 Chronic sinusitis, unspecified: Secondary | ICD-10-CM | POA: Diagnosis not present

## 2021-03-29 DIAGNOSIS — M47812 Spondylosis without myelopathy or radiculopathy, cervical region: Secondary | ICD-10-CM | POA: Diagnosis not present

## 2021-03-29 DIAGNOSIS — M531 Cervicobrachial syndrome: Secondary | ICD-10-CM | POA: Diagnosis not present

## 2021-03-29 DIAGNOSIS — M9902 Segmental and somatic dysfunction of thoracic region: Secondary | ICD-10-CM | POA: Diagnosis not present

## 2021-04-05 DIAGNOSIS — M531 Cervicobrachial syndrome: Secondary | ICD-10-CM | POA: Diagnosis not present

## 2021-04-05 DIAGNOSIS — M546 Pain in thoracic spine: Secondary | ICD-10-CM | POA: Diagnosis not present

## 2021-04-05 DIAGNOSIS — M47812 Spondylosis without myelopathy or radiculopathy, cervical region: Secondary | ICD-10-CM | POA: Diagnosis not present

## 2021-04-05 DIAGNOSIS — M6240 Contracture of muscle, unspecified site: Secondary | ICD-10-CM | POA: Diagnosis not present

## 2021-04-05 DIAGNOSIS — M9901 Segmental and somatic dysfunction of cervical region: Secondary | ICD-10-CM | POA: Diagnosis not present

## 2021-04-05 DIAGNOSIS — M5032 Other cervical disc degeneration, mid-cervical region, unspecified level: Secondary | ICD-10-CM | POA: Diagnosis not present

## 2021-04-05 DIAGNOSIS — J329 Chronic sinusitis, unspecified: Secondary | ICD-10-CM | POA: Diagnosis not present

## 2021-04-05 DIAGNOSIS — M9902 Segmental and somatic dysfunction of thoracic region: Secondary | ICD-10-CM | POA: Diagnosis not present

## 2021-04-19 ENCOUNTER — Other Ambulatory Visit (HOSPITAL_COMMUNITY): Payer: Self-pay

## 2021-04-24 DIAGNOSIS — M47812 Spondylosis without myelopathy or radiculopathy, cervical region: Secondary | ICD-10-CM | POA: Diagnosis not present

## 2021-04-24 DIAGNOSIS — M5032 Other cervical disc degeneration, mid-cervical region, unspecified level: Secondary | ICD-10-CM | POA: Diagnosis not present

## 2021-04-24 DIAGNOSIS — M9902 Segmental and somatic dysfunction of thoracic region: Secondary | ICD-10-CM | POA: Diagnosis not present

## 2021-04-24 DIAGNOSIS — M6240 Contracture of muscle, unspecified site: Secondary | ICD-10-CM | POA: Diagnosis not present

## 2021-04-24 DIAGNOSIS — J329 Chronic sinusitis, unspecified: Secondary | ICD-10-CM | POA: Diagnosis not present

## 2021-04-24 DIAGNOSIS — M531 Cervicobrachial syndrome: Secondary | ICD-10-CM | POA: Diagnosis not present

## 2021-04-24 DIAGNOSIS — M546 Pain in thoracic spine: Secondary | ICD-10-CM | POA: Diagnosis not present

## 2021-04-24 DIAGNOSIS — M9901 Segmental and somatic dysfunction of cervical region: Secondary | ICD-10-CM | POA: Diagnosis not present

## 2021-04-30 ENCOUNTER — Other Ambulatory Visit: Payer: Self-pay | Admitting: Internal Medicine

## 2021-04-30 DIAGNOSIS — I700 Atherosclerosis of aorta: Secondary | ICD-10-CM

## 2021-05-07 ENCOUNTER — Other Ambulatory Visit (HOSPITAL_COMMUNITY): Payer: Self-pay

## 2021-05-14 ENCOUNTER — Other Ambulatory Visit (HOSPITAL_COMMUNITY): Payer: Self-pay

## 2021-05-16 ENCOUNTER — Other Ambulatory Visit (HOSPITAL_COMMUNITY): Payer: Self-pay

## 2021-05-16 DIAGNOSIS — L309 Dermatitis, unspecified: Secondary | ICD-10-CM | POA: Diagnosis not present

## 2021-05-16 DIAGNOSIS — B009 Herpesviral infection, unspecified: Secondary | ICD-10-CM | POA: Diagnosis not present

## 2021-05-16 DIAGNOSIS — D2271 Melanocytic nevi of right lower limb, including hip: Secondary | ICD-10-CM | POA: Diagnosis not present

## 2021-05-16 DIAGNOSIS — D485 Neoplasm of uncertain behavior of skin: Secondary | ICD-10-CM | POA: Diagnosis not present

## 2021-05-16 DIAGNOSIS — L720 Epidermal cyst: Secondary | ICD-10-CM | POA: Diagnosis not present

## 2021-05-16 DIAGNOSIS — E881 Lipodystrophy, not elsewhere classified: Secondary | ICD-10-CM | POA: Diagnosis not present

## 2021-05-16 MED ORDER — VALACYCLOVIR HCL 500 MG PO TABS
500.0000 mg | ORAL_TABLET | Freq: Every day | ORAL | 3 refills | Status: DC
  Filled 2021-05-16: qty 90, 90d supply, fill #0
  Filled 2021-10-04: qty 90, 90d supply, fill #1
  Filled 2022-05-07: qty 90, 90d supply, fill #2

## 2021-05-22 ENCOUNTER — Other Ambulatory Visit (HOSPITAL_COMMUNITY): Payer: Self-pay

## 2021-06-04 ENCOUNTER — Other Ambulatory Visit (HOSPITAL_COMMUNITY): Payer: Self-pay

## 2021-06-05 ENCOUNTER — Ambulatory Visit
Admission: RE | Admit: 2021-06-05 | Discharge: 2021-06-05 | Disposition: A | Discharge status: Home / Self Care | Payer: 59 | Source: Ambulatory Visit | Attending: Emergency Medicine | Admitting: Emergency Medicine

## 2021-06-05 VITALS — BP 123/64 | HR 78 | Temp 98.4°F | Resp 18

## 2021-06-05 DIAGNOSIS — J029 Acute pharyngitis, unspecified: Secondary | ICD-10-CM | POA: Diagnosis not present

## 2021-06-05 DIAGNOSIS — B349 Viral infection, unspecified: Secondary | ICD-10-CM | POA: Insufficient documentation

## 2021-06-05 DIAGNOSIS — R5383 Other fatigue: Secondary | ICD-10-CM | POA: Diagnosis not present

## 2021-06-05 DIAGNOSIS — Z20828 Contact with and (suspected) exposure to other viral communicable diseases: Secondary | ICD-10-CM | POA: Insufficient documentation

## 2021-06-05 LAB — POCT MONO SCREEN (KUC): Mono, POC: NEGATIVE

## 2021-06-05 LAB — POCT RAPID STREP A (OFFICE): Rapid Strep A Screen: NEGATIVE

## 2021-06-05 NOTE — ED Triage Notes (Addendum)
Pt states for two weeks she has been fatigued, last night she developed a sore throat. She has had an exposure to mono, she states saturday she tested negative for covid with the at home test. ? ?Home interventions: tylenol today  ?

## 2021-06-05 NOTE — ED Notes (Addendum)
POCT mono and rapid strep actually collected at 1:40 PM today (06/05/2021).  ?

## 2021-06-05 NOTE — Discharge Instructions (Signed)
Your symptoms and physical exam findings are concerning for a viral respiratory infection.  Viral infections just have to run their course, 7 days coming, 7 days staying in 7 days going.  Conservative care with rest, hydration and bland diet is recommended. ?  ?You were tested for COVID-19 today.  The result of your COVID-19 test will be posted to your MyChart once it is complete, typically this takes 36 to 48 hours. If your COVID-19 test is positive, please advise the nurse whether you are interested in taking the recommended antiviral for COVID-19 called Paxlovid. ? ?Your Monospot test today was negative.  No further testing is indicated.  For your information, the incubation period after exposure to mono is 6 months so you may wish to mark this date on your calendar and remind yourself to check for symptoms again in 6 months. ? ?Your strep test today is negative.  Streptococcal throat culture will be performed per our protocol.  The result of your throat culture will be posted to your MyChart once it is complete, this typically takes 3 to 5 days.  If your streptococcal throat culture is positive, you will be contacted by phone and antibiotics will be prescribed for you.   ?  ?Please see the list below for recommended medications, dosages and frequencies to provide relief of your current symptoms:   ?  ?Advil, Motrin (ibuprofen): This is a good anti-inflammatory medication which addresses aches, pains and inflammation of the upper airways that causes sinus and nasal congestion as well as in the lower airways which makes your cough feel tight and sometimes burn.  I recommend that you take between 400 to 600 mg every 6-8 hours as needed.    ?  ?Tylenol (acetaminophen): This is a good fever reducer.  If your body temperature rises above 101.5 as measured with a thermometer, it is recommended that you take 1,000 mg every 8 hours until your temperature falls below 101.5, please not take more than 3,000 mg of  acetaminophen either as a separate medication or as in ingredient in an over-the-counter cold/flu preparation within a 24-hour period.    ?  ?Please follow-up within the next 5-7 days either with your primary care provider if your fatigue persists.  If you do not have a primary care provider, we will assist you in finding one. ?  ?Thank you for visiting urgent care today.  We appreciate the opportunity to participate in your care. ? ?

## 2021-06-05 NOTE — ED Provider Notes (Signed)
?[facility] ? ? ? ?CSN: [id number] ?Arrival date & time: 06/05/21  1256 ?  ? ?HISTORY  ? ?Chief Complaint  ?Patient presents with  ? Fatigue  ?  Fatigue past 2 weeks, feverish, sore throat today, exposed to person with mono - Entered by patient  ? ?HPI ?Gina Graves is a 66 y.o. female. Pt states that for two weeks she has been fatigued, had a slightly elevated temperature and then last night she developed a sore throat. She reports that one of her coworkers had mono a few weeks ago, states she would like be tested for mono today.  Patient states that 10 days ago she tested negative for COVID-19 using a home COVID test.  Patient denies nausea, vomiting, diarrhea, nasal congestion, runny nose, cough, shortness of breath, loss of taste or smell. ? ?The history is provided by the patient.  ?Past Medical History:  ?Diagnosis Date  ? Asthma   ? recurrence @ age 6; ? childhood asthma as EIB  ? GERD (gastroesophageal reflux disease) 1995  ? hiatal hernia   ? ?Patient Active Problem List  ? Diagnosis Date Noted  ? 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 Dysfunction of left eustachian tube 10/20/2017  ? Joint swelling 02/06/2017  ? Routine general medical examination at a health care facility 06/09/2015  ? Heart murmur 06/22/2014  ? Hyperlipidemia 03/13/2014  ? Family history of early CAD 03/04/2014  ? Osteopenia 05/10/2009  ? Asthma, mild intermittent 04/25/2008  ? Depression 01/28/2007  ? Allergic rhinitis 01/28/2007  ? ?Past Surgical History:  ?Procedure Laterality Date  ? ABDOMINAL HYSTERECTOMY  1997  ? uterine adenoma & dysmenorrhea  ? APPENDECTOMY    ? COLONOSCOPY  2010  ? negative, Dr Cristina Gong  ? tonsillectomy    ? ?OB History   ?No obstetric history on file. ?  ? ?Home Medications   ? ?Prior to Admission medications   ?Medication Sig Start Date End Date Taking? Authorizing Provider  ?Acetylcysteine (N-ACETYL-L-CYSTEINE) 600 MG CAPS Take by mouth.    [provider]  ?albuterol (VENTOLIN  HFA) 108 (90 Base) MCG/ACT inhaler Inhale 2 puffs into the lungs every 4 (four) hours as needed for wheezing or shortness of breath (wheezing or shortness of breath). 10/16/20   Hoyt Koch, MD  ?ALPRAZolam Duanne Moron) 0.5 MG tablet Take 0.5-1 tablets (0.25-0.5 mg total) by mouth 2 (two) times daily as needed for anxiety 02/15/21     ?ALPRAZolam (XANAX) 0.5 MG tablet Take 0.5-1 tablets (0.25-0.5 mg total) by mouth 2 (two) times daily as needed. 03/08/21     ?Budeson-Glycopyrrol-Formoterol (BREZTRI AEROSPHERE) 160-9-4.8 MCG/ACT AERO Inhale 2 puffs into the lungs in the morning and at bedtime. 12/26/20   Freddi Starr, MD  ?cetirizine (ZYRTEC) 10 MG tablet Take 10 mg by mouth daily.    [provider]  ?Cholecalciferol (VITAMIN D3) 2000 UNITS TABS Take 2,000 Units by mouth daily as needed (vitamin supplement).     [provider]  ?COVID-19 mRNA bivalent vaccine, Pfizer, (PFIZER COVID-19 VAC BIVALENT) injection Inject into the muscle. 12/22/20   Carlyle Basques, MD  ?cyclobenzaprine (FLEXERIL) 10 MG tablet Take 1 tablet (10 mg total) by mouth 3 (three) times daily as needed for muscle spasms 03/16/21     ?desoximetasone (TOPICORT) 0.25 % cream Apply 1 application topically 2 (two) times daily. 06/24/20   Chevis Pretty, FNP  ?DULoxetine (CYMBALTA) 60 MG capsule Take 2 capsules (120 mg total) by mouth daily. 03/08/21     ?fluticasone (  FLONASE) 50 MCG/ACT nasal spray Place 1 spray into both nostrils 2 (two) times daily as needed for allergies or rhinitis (for sinus congestion).    [provider]  ?ibuprofen (ADVIL) 800 MG tablet Take 1 tablet (800 mg total) by mouth every 8 (eight) hours as needed. 11/15/20   Hoyt Koch, MD  ?ketoconazole (NIZORAL) 2 % shampoo Apply approximately 36m externally once a day 05/11/20     ?meloxicam (MOBIC) 15 MG tablet Take 1 tablet (15 mg total) by mouth daily. 03/16/21     ?montelukast (SINGULAIR) 10 MG tablet Take 1 tablet (10 mg total) by  mouth daily. 02/12/21   CHoyt Koch MD  ?nystatin-triamcinolone ointment ([facility] Apply 1 application topically 2 (two) times daily. 05/11/20   CHoyt Koch MD  ?omeprazole (PRILOSEC) 20 MG capsule TAKE 1 CAPSULE BY MOUTH 2 TIMES DAILY BEFORE A MEAL. 03/01/19 02/29/20  CHoyt Koch MD  ?traZODone (DESYREL) 50 MG tablet Take 1-2 tablets (50-100 mg total) by mouth at bedtime. 03/08/21     ?TURMERIC PO Take 1 tablet by mouth daily as needed (supplement).     [provider]  ?valACYclovir (VALTREX) 500 MG tablet Take 1 tablet (500 mg total) by mouth daily as needed. 01/23/21     ?valACYclovir (VALTREX) 500 MG tablet Take 1 tablet (500 mg total) by mouth daily. 05/16/21     ? ?Family History ?Family History  ?Problem Relation Age of Onset  ? Breast cancer Mother   ? Asthma Mother   ?     asthmatic bronchitis  ? Heart attack Father 577 ?     3 vessel CABG  ? Hashimoto's thyroiditis Sister   ? Autism Brother   ? Diabetes Paternal Grandmother   ? Heart failure Paternal Grandmother   ?     in 716s ? Diabetes Paternal Grandfather   ? Heart attack Paternal Grandfather 479 ? Heart attack Paternal Uncle 439 ? Coronary artery disease Other   ?     paternal FH  ? Stroke Neg Hx   ? COPD Neg Hx   ? ?Social History ?Social History  ? ?Tobacco Use  ? Smoking status: Never  ? Smokeless tobacco: Never  ? Tobacco comments:  ?  second hand smoke X 25 years  ?Vaping Use  ? Vaping Use: Never used  ?Substance Use Topics  ? Alcohol use: Yes  ?  Alcohol/week: 2.0 standard drinks  ?  Types: 2 Shots of liquor per week  ?  Comment:  < 2/ week  ? Drug use: No  ? ?Allergies   ?Bupropion hcl ? ?Review of Systems ?Review of Systems ?Pertinent findings noted in history of present illness.  ? ?Physical Exam ?Triage Vital Signs ?ED Triage Vitals  ?Enc Vitals Group  ?   BP 11/17/20 0827 (!) 147/82  ?   Pulse Rate 11/17/20 0827 72  ?   Resp 11/17/20 0827 18  ?   Temp 11/17/20 0827 98.3 ?F (36.8 ?C)  ?   Temp Source 11/17/20  0827 Oral  ?   SpO2 11/17/20 0827 98 %  ?   Weight --   ?   Height --   ?   Head Circumference --   ?   Peak Flow --   ?   Pain Score 11/17/20 0826 5  ?   Pain Loc --   ?   Pain Edu? --   ?   [place]cl. in [place]   ?  No data found. ? ?Updated Vital Signs ?BP 123/64 (BP Location: Right Arm)   Pulse 78   Temp 98.4 ?F (36.9 ?C) (Oral)   Resp 18   SpO2 97%  ? ?Physical Exam ?Vitals and nursing note reviewed.  ?Constitutional:   ?   General: She is not in acute distress. ?   Appearance: Normal appearance. She is not ill-appearing.  ?HENT:  ?   Head: Normocephalic and atraumatic.  ?   Salivary Glands: Right salivary gland is not diffusely enlarged or tender. Left salivary gland is not diffusely enlarged or tender.  ?   Right Ear: Tympanic membrane, ear canal and external ear normal. No drainage. No middle ear effusion. There is no impacted cerumen. Tympanic membrane is not erythematous or bulging.  ?   Left Ear: Tympanic membrane, ear canal and external ear normal. No drainage.  No middle ear effusion. There is no impacted cerumen. Tympanic membrane is not erythematous or bulging.  ?   Nose: Nose normal. No nasal deformity, septal deviation, mucosal edema, congestion or rhinorrhea.  ?   Right Turbinates: Not enlarged, swollen or pale.  ?   Left Turbinates: Not enlarged, swollen or pale.  ?   Right Sinus: No maxillary sinus tenderness or frontal sinus tenderness.  ?   Left Sinus: No maxillary sinus tenderness or frontal sinus tenderness.  ?   Mouth/Throat:  ?   Lips: Pink. No lesions.  ?   Mouth: Mucous membranes are moist. No oral lesions.  ?   Pharynx: Oropharynx is clear. Uvula midline. Posterior oropharyngeal erythema and uvula swelling present. No pharyngeal swelling or oropharyngeal exudate.  ?   Tonsils: No tonsillar exudate. 0 on the right. 0 on the left.  ?Eyes:  ?   General: Lids are normal.     ?   Right eye: No discharge.     ?   Left eye: No discharge.  ?   Extraocular Movements: Extraocular movements intact.  ?    Conjunctiva/sclera: Conjunctivae normal.  ?   Right eye: Right conjunctiva is not injected.  ?   Left eye: Left conjunctiva is not injected.  ?Neck:  ?   Trachea: Trachea and phonation normal.  ?Cardio

## 2021-06-06 LAB — NOVEL CORONAVIRUS, NAA: SARS-CoV-2, NAA: NOT DETECTED

## 2021-06-07 ENCOUNTER — Ambulatory Visit: Payer: 59 | Admitting: Internal Medicine

## 2021-06-07 LAB — CULTURE, GROUP A STREP (THRC)

## 2021-06-11 ENCOUNTER — Other Ambulatory Visit (HOSPITAL_COMMUNITY): Payer: Self-pay

## 2021-06-13 ENCOUNTER — Ambulatory Visit (INDEPENDENT_AMBULATORY_CARE_PROVIDER_SITE_OTHER)
Admission: RE | Admit: 2021-06-13 | Discharge: 2021-06-13 | Disposition: A | Discharge status: Home / Self Care | Payer: Self-pay | Source: Ambulatory Visit | Attending: Internal Medicine | Admitting: Internal Medicine

## 2021-06-13 DIAGNOSIS — I700 Atherosclerosis of aorta: Secondary | ICD-10-CM

## 2021-06-19 ENCOUNTER — Other Ambulatory Visit: Payer: Self-pay | Admitting: Internal Medicine

## 2021-06-20 ENCOUNTER — Other Ambulatory Visit (HOSPITAL_COMMUNITY): Payer: Self-pay

## 2021-06-20 MED ORDER — IBUPROFEN 800 MG PO TABS
800.0000 mg | ORAL_TABLET | Freq: Three times a day (TID) | ORAL | 1 refills | Status: DC | PRN
  Filled 2021-06-20: qty 90, 30d supply, fill #0
  Filled 2021-09-18: qty 90, 30d supply, fill #1

## 2021-06-21 ENCOUNTER — Encounter: Payer: Self-pay | Admitting: Internal Medicine

## 2021-06-26 DIAGNOSIS — H5203 Hypermetropia, bilateral: Secondary | ICD-10-CM | POA: Diagnosis not present

## 2021-06-29 ENCOUNTER — Other Ambulatory Visit (HOSPITAL_COMMUNITY): Payer: Self-pay

## 2021-06-29 MED ORDER — PRAVASTATIN SODIUM 20 MG PO TABS
20.0000 mg | ORAL_TABLET | Freq: Every day | ORAL | 3 refills | Status: DC
  Filled 2021-06-29: qty 90, 90d supply, fill #0
  Filled 2021-09-24: qty 27, 27d supply, fill #1
  Filled 2021-09-25: qty 63, 63d supply, fill #1
  Filled 2021-12-23: qty 90, 90d supply, fill #2
  Filled 2022-03-31: qty 90, 90d supply, fill #3

## 2021-07-30 ENCOUNTER — Other Ambulatory Visit (HOSPITAL_COMMUNITY): Payer: Self-pay

## 2021-08-13 ENCOUNTER — Other Ambulatory Visit (HOSPITAL_COMMUNITY): Payer: Self-pay

## 2021-09-10 ENCOUNTER — Other Ambulatory Visit (HOSPITAL_COMMUNITY): Payer: Self-pay

## 2021-09-19 ENCOUNTER — Other Ambulatory Visit (HOSPITAL_COMMUNITY): Payer: Self-pay

## 2021-09-25 ENCOUNTER — Other Ambulatory Visit (HOSPITAL_COMMUNITY): Payer: Self-pay

## 2021-10-01 ENCOUNTER — Other Ambulatory Visit (HOSPITAL_COMMUNITY): Payer: Self-pay

## 2021-10-04 ENCOUNTER — Other Ambulatory Visit (HOSPITAL_COMMUNITY): Payer: Self-pay

## 2021-10-04 MED ORDER — ALPRAZOLAM 0.5 MG PO TABS
0.2500 mg | ORAL_TABLET | Freq: Two times a day (BID) | ORAL | 0 refills | Status: DC | PRN
Start: 2021-10-04 — End: 2022-05-30
  Filled 2021-10-04: qty 180, 90d supply, fill #0

## 2021-10-05 ENCOUNTER — Other Ambulatory Visit (HOSPITAL_COMMUNITY): Payer: Self-pay

## 2021-10-05 MED ORDER — ALBUTEROL SULFATE HFA 108 (90 BASE) MCG/ACT IN AERS
2.0000 | INHALATION_SPRAY | RESPIRATORY_TRACT | 0 refills | Status: DC | PRN
  Filled 2021-10-05: qty 6.7, 16d supply, fill #0

## 2021-10-09 ENCOUNTER — Other Ambulatory Visit (HOSPITAL_COMMUNITY): Payer: Self-pay

## 2021-10-16 ENCOUNTER — Other Ambulatory Visit (HOSPITAL_COMMUNITY): Payer: Self-pay

## 2021-10-16 DIAGNOSIS — D225 Melanocytic nevi of trunk: Secondary | ICD-10-CM | POA: Diagnosis not present

## 2021-10-16 DIAGNOSIS — L578 Other skin changes due to chronic exposure to nonionizing radiation: Secondary | ICD-10-CM | POA: Diagnosis not present

## 2021-10-16 DIAGNOSIS — D2272 Melanocytic nevi of left lower limb, including hip: Secondary | ICD-10-CM | POA: Diagnosis not present

## 2021-10-16 DIAGNOSIS — L570 Actinic keratosis: Secondary | ICD-10-CM | POA: Diagnosis not present

## 2021-10-16 DIAGNOSIS — L820 Inflamed seborrheic keratosis: Secondary | ICD-10-CM | POA: Diagnosis not present

## 2021-10-16 DIAGNOSIS — L821 Other seborrheic keratosis: Secondary | ICD-10-CM | POA: Diagnosis not present

## 2021-10-18 ENCOUNTER — Ambulatory Visit (INDEPENDENT_AMBULATORY_CARE_PROVIDER_SITE_OTHER): Payer: 59 | Admitting: Pulmonary Disease

## 2021-10-18 ENCOUNTER — Encounter: Payer: Self-pay | Admitting: Pulmonary Disease

## 2021-10-18 VITALS — BP 114/80 | HR 87 | Ht 65.0 in | Wt 178.0 lb

## 2021-10-18 DIAGNOSIS — J4540 Moderate persistent asthma, uncomplicated: Secondary | ICD-10-CM

## 2021-10-18 NOTE — Patient Instructions (Addendum)
Continue to use breztri 2 puffs twice daily - rinse mouth out after each use  Continue to use albuterol inhaler as needed  Continue zyrtec for allergies  Continue montelukast daily  Follow up in 1 year

## 2021-10-18 NOTE — Progress Notes (Signed)
Synopsis: Referred in December 2022 for Asthma by Pricilla Holm, MD  Subjective:   PATIENT ID: Gina Graves GENDER: female DOB: Feb 17, 1955, MRN: [id number]   HPI  Chief Complaint  Patient presents with   Follow-up    F/U for asthma. States she had SOB up until last month.    Gina Graves is a 66 year old woman, never smokwer with hiatal hernia, GERD and asthma who returns to pulmonary clinic for asthma.   She has been doing well on breztri. She recently gained her energy levels back and has been able to resume physical activity with walking. She continues zyrtec and montelukast daily for allergies.  OV 02/20/21 She reports improvement in her breathing since starting breztri inhaler 2 puffs twice daily since last month. She reports a 3 day episode of fatigue, shortness of breath and chest discomfort that improved spontaneously.   CTA Chest 12/27/20 was negative for pulmonary emboli and normal lung parenchyma.   OV 12/26/20 Patient has history of asthma in which she was diagnosed 20 years ago and she has been well controlled on ICS/LABA inhalers over many years until recently having [place] in 9/22.  Since having COVID she has had increased chest tightness, right-sided chest discomfort, daily shortness of breath and intermittent wheezing.  Her ICS/LAMA inhaler has been increased to Breo Ellipta 200-25 MCG 1 puff daily at the end of October which initially helped her symptoms but then she continued to have return of the above symptoms.  She does have seasonal allergies in which she takes Singulair and Zyrtec daily.  She does have sinus congestion and postnasal drainage in which she takes fluticasone nasal spray.  She does not report increase in sinus symptoms since having COVID.  She also has history of GERD which is no different since having COVID.  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  They recently moved into a new office that was  renovated but denies any significant changes in her symptoms in the work environment compared to being at home.  She does have history of secondhand smoke exposure from her husband.  She is otherwise a never smoker.  Past Medical History:  Diagnosis Date   Asthma    recurrence @ age 73; ? childhood asthma as EIB   GERD (gastroesophageal reflux disease) 1995   hiatal hernia      Family History  Problem Relation Age of Onset   Breast cancer Mother    Asthma Mother        asthmatic bronchitis   Heart attack Father 66       3 vessel CABG   Hashimoto's thyroiditis Sister    Autism Brother    Diabetes Paternal Grandmother    Heart failure Paternal Grandmother        in 71s   Diabetes Paternal Grandfather    Heart attack Paternal Grandfather 38   Heart attack Paternal Uncle 21   Coronary artery disease Other        paternal FH   Stroke Neg Hx    COPD Neg Hx      Social History   Socioeconomic History   Marital status: Married    Spouse name: Not on file   Number of children: Not on file   Years of education: Not on file   Highest education level: Not on file  Occupational History   Not on file  Tobacco Use   Smoking status: Never   Smokeless tobacco: Never  Tobacco comments:    second hand smoke X 25 years  Vaping Use   Vaping Use: Never used  Substance and Sexual Activity   Alcohol use: Yes    Alcohol/week: 2.0 standard drinks of alcohol    Types: 2 Shots of liquor per week    Comment:  < 2/ week   Drug use: No   Sexual activity: Not on file  Other Topics Concern   Not on file  Social History Narrative   Not on file   Social Determinants of Health   Financial Resource Strain: Not on file  Food Insecurity: Not on file  Transportation Needs: Not on file  Physical Activity: Not on file  Stress: Not on file  Social Connections: Not on file  Intimate Partner Violence: Not on file     Allergies  Allergen Reactions   Bupropion Hcl     REACTION: headaches      Outpatient Medications Prior to Visit  Medication Sig Dispense Refill   Acetylcysteine (N-ACETYL-L-CYSTEINE) 600 MG CAPS Take by mouth.     albuterol (VENTOLIN HFA) 108 (90 Base) MCG/ACT inhaler Inhale 2 puffs into the lungs every 4 (four) hours as needed for wheezing or shortness of breath 6.7 g 0   ALPRAZolam (XANAX) 0.5 MG tablet Take 0.5-1 tablets (0.25-0.5 mg total) by mouth 2 (two) times daily as needed for anxiety 60 tablet 1   ALPRAZolam (XANAX) 0.5 MG tablet Take 0.5-1 tablets (0.25-0.5 mg total) by mouth 2 (two) times daily as needed. 180 tablet 1   ALPRAZolam (XANAX) 0.5 MG tablet Take 0.5-1 tablets (0.25-0.5 mg total) by mouth 2 (two) times daily as needed. 180 tablet 0   Budeson-Glycopyrrol-Formoterol (BREZTRI AEROSPHERE) 160-9-4.8 MCG/ACT AERO Inhale 2 puffs into the lungs in the morning and at bedtime. 10.7 g 6   cetirizine (ZYRTEC) 10 MG tablet Take 10 mg by mouth daily.     Cholecalciferol (VITAMIN D3) 2000 UNITS TABS Take 2,000 Units by mouth daily as needed (vitamin supplement).      COVID-19 mRNA bivalent vaccine, Pfizer, (PFIZER COVID-19 VAC BIVALENT) injection Inject into the muscle. 0.3 mL 0   DULoxetine (CYMBALTA) 60 MG capsule Take 2 capsules (120 mg total) by mouth daily. 180 capsule 3   fluticasone (FLONASE) 50 MCG/ACT nasal spray Place 1 spray into both nostrils 2 (two) times daily as needed for allergies or rhinitis (for sinus congestion).     ibuprofen (ADVIL) 800 MG tablet Take 1 tablet (800 mg total) by mouth every 8 (eight) hours as needed. 90 tablet 1   ketoconazole (NIZORAL) 2 % shampoo Apply approximately 27m externally once a day 120 mL 2   montelukast (SINGULAIR) 10 MG tablet Take 1 tablet (10 mg total) by mouth daily. 90 tablet 3   nystatin-triamcinolone ointment (MYCOLOG) Apply 1 application topically 2 (two) times daily. 100 g 3   pravastatin (PRAVACHOL) 20 MG tablet Take 1 tablet (20 mg total) by mouth daily. 90 tablet 3   traZODone (DESYREL) 50 MG  tablet Take 1-2 tablets (50-100 mg total) by mouth at bedtime. 180 tablet 3   TURMERIC PO Take 1 tablet by mouth daily as needed (supplement).      valACYclovir (VALTREX) 500 MG tablet Take 1 tablet (500 mg total) by mouth daily as needed. 30 tablet 6   valACYclovir (VALTREX) 500 MG tablet Take 1 tablet (500 mg total) by mouth daily. 90 tablet 3   omeprazole (PRILOSEC) 20 MG capsule TAKE 1 CAPSULE BY MOUTH 2 TIMES DAILY  BEFORE A MEAL. 180 capsule 3   cyclobenzaprine (FLEXERIL) 10 MG tablet Take 1 tablet (10 mg total) by mouth 3 (three) times daily as needed for muscle spasms 45 tablet 0   desoximetasone (TOPICORT) 0.25 % cream Apply 1 application topically 2 (two) times daily. 60 g 0   meloxicam (MOBIC) 15 MG tablet Take 1 tablet (15 mg total) by mouth daily. 40 tablet 0   No facility-administered medications prior to visit.   Review of Systems  Constitutional:  Negative for chills, fever, malaise/fatigue and weight loss.  HENT:  Negative for congestion, sinus pain and sore throat.   Eyes: Negative.   Respiratory:  Negative for cough, hemoptysis, sputum production, shortness of breath and wheezing.   Cardiovascular:  Negative for chest pain, palpitations, orthopnea, claudication and leg swelling.  Gastrointestinal:  Negative for abdominal pain, heartburn, nausea and vomiting.  Genitourinary: Negative.   Musculoskeletal:  Negative for joint pain and myalgias.  Skin:  Negative for rash.  Neurological:  Negative for weakness and headaches.  Endo/Heme/Allergies: Negative.     Objective:   Vitals:   10/18/21 1328  BP: 114/80  Pulse: 87  SpO2: 98%  Weight: 178 lb (80.7 kg)  Height: '5\' 5"'$  (1.651 m)   Physical Exam Constitutional:      General: She is not in acute distress.    Appearance: She is not ill-appearing.  HENT:     Head: Normocephalic and atraumatic.  Eyes:     General: No scleral icterus.    Conjunctiva/sclera: Conjunctivae normal.  Cardiovascular:     Rate and Rhythm:  Normal rate and regular rhythm.     Pulses: Normal pulses.     Heart sounds: Normal heart sounds. No murmur heard. Pulmonary:     Effort: Pulmonary effort is normal.     Breath sounds: Normal breath sounds. No wheezing, rhonchi or rales.  Musculoskeletal:     Right lower leg: No edema.     Left lower leg: No edema.  Skin:    General: Skin is warm and dry.  Neurological:     General: No focal deficit present.     Mental Status: She is alert.  Psychiatric:        Mood and Affect: Mood normal.        Behavior: Behavior normal.        Thought Content: Thought content normal.        Judgment: Judgment normal.    CBC    Component Value Date/Time   WBC 4.1 02/21/2021 0909   RBC 4.35 02/21/2021 0909   HGB 13.6 02/21/2021 0909   HCT 41.2 02/21/2021 0909   PLT 291.0 02/21/2021 0909   MCV 94.7 02/21/2021 0909   MCHC 33.1 02/21/2021 0909   RDW 13.5 02/21/2021 0909   LYMPHSABS 1.8 03/09/2014 0800   MONOABS 0.4 03/09/2014 0800   EOSABS 0.1 03/09/2014 0800   BASOSABS 0.0 03/09/2014 0800   Chest imaging: CTA Chest 12/27/20 No pulmonary emboli. Normal examination with exception of mild atherosclerotic calcification of the thoracic aorta. Maximal diameter of the ascending aorta 3.9 cm. Follow-up recommendations do not begin until a diameter of 4 cm.  CXR 05/05/14 The heart size and mediastinal contours are normal. The lungs are clear. There is no pleural effusion or pneumothorax. No acute osseous findings are identified.  PFT:    Latest Ref Rng & Units 12/26/2020    9:47 AM  PFT Results  FVC-Pre L 3.36   FVC-Predicted Pre % 100  FVC-Post L 3.23   FVC-Predicted Post % 96   Pre FEV1/FVC % % 87   Post FEV1/FCV % % 86   FEV1-Pre L 2.91   FEV1-Predicted Pre % 113   FEV1-Post L 2.77   DLCO uncorrected ml/min/mmHg 20.22   DLCO UNC% % 97   DLCO corrected ml/min/mmHg 20.22   DLCO COR %Predicted % 97   DLVA Predicted % 115   PFTs 12/2020: within normal  limits  Labs:  Path:  Echo 06/29/14: LVEF 60-65%. Mild LVH. Grade I diastolic dysfunction. RV size and function is normal.  Heart Catheterization:  Assessment & Plan:   Moderate persistent asthma without complication  Discussion: Gina Graves is a 66 year old woman, never smokwer with hiatal hernia, GERD and asthma who returns to pulmonary clinic for asthma.   She continues to do well on breztri inhaler therapy. She can continue albuterol inhaler as needed along with zyrtec and montelukast for allergies.   Follow-up in 1 year.  Freda Jackson, MD [facility] Office: [phone]   Current Outpatient Medications:    Acetylcysteine (N-ACETYL-L-CYSTEINE) 600 MG CAPS, Take by mouth., Disp: , Rfl:    albuterol (VENTOLIN HFA) 108 (90 Base) MCG/ACT inhaler, Inhale 2 puffs into the lungs every 4 (four) hours as needed for wheezing or shortness of breath, Disp: 6.7 g, Rfl: 0   ALPRAZolam (XANAX) 0.5 MG tablet, Take 0.5-1 tablets (0.25-0.5 mg total) by mouth 2 (two) times daily as needed for anxiety, Disp: 60 tablet, Rfl: 1   ALPRAZolam (XANAX) 0.5 MG tablet, Take 0.5-1 tablets (0.25-0.5 mg total) by mouth 2 (two) times daily as needed., Disp: 180 tablet, Rfl: 1   ALPRAZolam (XANAX) 0.5 MG tablet, Take 0.5-1 tablets (0.25-0.5 mg total) by mouth 2 (two) times daily as needed., Disp: 180 tablet, Rfl: 0   Budeson-Glycopyrrol-Formoterol (BREZTRI AEROSPHERE) 160-9-4.8 MCG/ACT AERO, Inhale 2 puffs into the lungs in the morning and at bedtime., Disp: 10.7 g, Rfl: 6   cetirizine (ZYRTEC) 10 MG tablet, Take 10 mg by mouth daily., Disp: , Rfl:    Cholecalciferol (VITAMIN D3) 2000 UNITS TABS, Take 2,000 Units by mouth daily as needed (vitamin supplement). , Disp: , Rfl:    COVID-19 mRNA bivalent vaccine, Pfizer, (PFIZER COVID-19 VAC BIVALENT) injection, Inject into the muscle., Disp: 0.3 mL, Rfl: 0   DULoxetine (CYMBALTA) 60 MG capsule, Take 2 capsules (120 mg total) by  mouth daily., Disp: 180 capsule, Rfl: 3   fluticasone (FLONASE) 50 MCG/ACT nasal spray, Place 1 spray into both nostrils 2 (two) times daily as needed for allergies or rhinitis (for sinus congestion)., Disp: , Rfl:    ibuprofen (ADVIL) 800 MG tablet, Take 1 tablet (800 mg total) by mouth every 8 (eight) hours as needed., Disp: 90 tablet, Rfl: 1   ketoconazole (NIZORAL) 2 % shampoo, Apply approximately 65m externally once a day, Disp: 120 mL, Rfl: 2   montelukast (SINGULAIR) 10 MG tablet, Take 1 tablet (10 mg total) by mouth daily., Disp: 90 tablet, Rfl: 3   nystatin-triamcinolone ointment (MYCOLOG), Apply 1 application topically 2 (two) times daily., Disp: 100 g, Rfl: 3   pravastatin (PRAVACHOL) 20 MG tablet, Take 1 tablet (20 mg total) by mouth daily., Disp: 90 tablet, Rfl: 3   traZODone (DESYREL) 50 MG tablet, Take 1-2 tablets (50-100 mg total) by mouth at bedtime., Disp: 180 tablet, Rfl: 3   TURMERIC PO, Take 1 tablet by mouth daily as needed (supplement). , Disp: , Rfl:    valACYclovir (  VALTREX) 500 MG tablet, Take 1 tablet (500 mg total) by mouth daily as needed., Disp: 30 tablet, Rfl: 6   valACYclovir (VALTREX) 500 MG tablet, Take 1 tablet (500 mg total) by mouth daily., Disp: 90 tablet, Rfl: 3   omeprazole (PRILOSEC) 20 MG capsule, TAKE 1 CAPSULE BY MOUTH 2 TIMES DAILY BEFORE A MEAL., Disp: 180 capsule, Rfl: 3

## 2021-10-31 ENCOUNTER — Other Ambulatory Visit (HOSPITAL_COMMUNITY): Payer: Self-pay

## 2021-11-12 ENCOUNTER — Other Ambulatory Visit (HOSPITAL_COMMUNITY): Payer: Self-pay

## 2021-12-05 DIAGNOSIS — M6240 Contracture of muscle, unspecified site: Secondary | ICD-10-CM | POA: Diagnosis not present

## 2021-12-05 DIAGNOSIS — M47812 Spondylosis without myelopathy or radiculopathy, cervical region: Secondary | ICD-10-CM | POA: Diagnosis not present

## 2021-12-05 DIAGNOSIS — M546 Pain in thoracic spine: Secondary | ICD-10-CM | POA: Diagnosis not present

## 2021-12-05 DIAGNOSIS — M9902 Segmental and somatic dysfunction of thoracic region: Secondary | ICD-10-CM | POA: Diagnosis not present

## 2021-12-05 DIAGNOSIS — M531 Cervicobrachial syndrome: Secondary | ICD-10-CM | POA: Diagnosis not present

## 2021-12-05 DIAGNOSIS — M5032 Other cervical disc degeneration, mid-cervical region, unspecified level: Secondary | ICD-10-CM | POA: Diagnosis not present

## 2021-12-05 DIAGNOSIS — M9901 Segmental and somatic dysfunction of cervical region: Secondary | ICD-10-CM | POA: Diagnosis not present

## 2021-12-05 DIAGNOSIS — J329 Chronic sinusitis, unspecified: Secondary | ICD-10-CM | POA: Diagnosis not present

## 2021-12-07 DIAGNOSIS — M531 Cervicobrachial syndrome: Secondary | ICD-10-CM | POA: Diagnosis not present

## 2021-12-07 DIAGNOSIS — M6240 Contracture of muscle, unspecified site: Secondary | ICD-10-CM | POA: Diagnosis not present

## 2021-12-07 DIAGNOSIS — M546 Pain in thoracic spine: Secondary | ICD-10-CM | POA: Diagnosis not present

## 2021-12-07 DIAGNOSIS — M47812 Spondylosis without myelopathy or radiculopathy, cervical region: Secondary | ICD-10-CM | POA: Diagnosis not present

## 2021-12-07 DIAGNOSIS — M5032 Other cervical disc degeneration, mid-cervical region, unspecified level: Secondary | ICD-10-CM | POA: Diagnosis not present

## 2021-12-07 DIAGNOSIS — M9902 Segmental and somatic dysfunction of thoracic region: Secondary | ICD-10-CM | POA: Diagnosis not present

## 2021-12-07 DIAGNOSIS — J329 Chronic sinusitis, unspecified: Secondary | ICD-10-CM | POA: Diagnosis not present

## 2021-12-07 DIAGNOSIS — M9901 Segmental and somatic dysfunction of cervical region: Secondary | ICD-10-CM | POA: Diagnosis not present

## 2021-12-10 ENCOUNTER — Other Ambulatory Visit (HOSPITAL_COMMUNITY): Payer: Self-pay

## 2021-12-10 DIAGNOSIS — M9901 Segmental and somatic dysfunction of cervical region: Secondary | ICD-10-CM | POA: Diagnosis not present

## 2021-12-10 DIAGNOSIS — M6240 Contracture of muscle, unspecified site: Secondary | ICD-10-CM | POA: Diagnosis not present

## 2021-12-10 DIAGNOSIS — M47812 Spondylosis without myelopathy or radiculopathy, cervical region: Secondary | ICD-10-CM | POA: Diagnosis not present

## 2021-12-10 DIAGNOSIS — J329 Chronic sinusitis, unspecified: Secondary | ICD-10-CM | POA: Diagnosis not present

## 2021-12-10 DIAGNOSIS — M5032 Other cervical disc degeneration, mid-cervical region, unspecified level: Secondary | ICD-10-CM | POA: Diagnosis not present

## 2021-12-10 DIAGNOSIS — M546 Pain in thoracic spine: Secondary | ICD-10-CM | POA: Diagnosis not present

## 2021-12-10 DIAGNOSIS — M9902 Segmental and somatic dysfunction of thoracic region: Secondary | ICD-10-CM | POA: Diagnosis not present

## 2021-12-10 DIAGNOSIS — M531 Cervicobrachial syndrome: Secondary | ICD-10-CM | POA: Diagnosis not present

## 2021-12-12 DIAGNOSIS — M546 Pain in thoracic spine: Secondary | ICD-10-CM | POA: Diagnosis not present

## 2021-12-12 DIAGNOSIS — M531 Cervicobrachial syndrome: Secondary | ICD-10-CM | POA: Diagnosis not present

## 2021-12-12 DIAGNOSIS — M6240 Contracture of muscle, unspecified site: Secondary | ICD-10-CM | POA: Diagnosis not present

## 2021-12-12 DIAGNOSIS — M9902 Segmental and somatic dysfunction of thoracic region: Secondary | ICD-10-CM | POA: Diagnosis not present

## 2021-12-12 DIAGNOSIS — J329 Chronic sinusitis, unspecified: Secondary | ICD-10-CM | POA: Diagnosis not present

## 2021-12-12 DIAGNOSIS — M47812 Spondylosis without myelopathy or radiculopathy, cervical region: Secondary | ICD-10-CM | POA: Diagnosis not present

## 2021-12-12 DIAGNOSIS — M5032 Other cervical disc degeneration, mid-cervical region, unspecified level: Secondary | ICD-10-CM | POA: Diagnosis not present

## 2021-12-12 DIAGNOSIS — M9901 Segmental and somatic dysfunction of cervical region: Secondary | ICD-10-CM | POA: Diagnosis not present

## 2021-12-17 ENCOUNTER — Other Ambulatory Visit: Payer: Self-pay | Admitting: Internal Medicine

## 2021-12-18 ENCOUNTER — Other Ambulatory Visit (HOSPITAL_COMMUNITY): Payer: Self-pay

## 2021-12-19 ENCOUNTER — Other Ambulatory Visit (HOSPITAL_COMMUNITY): Payer: Self-pay

## 2021-12-19 DIAGNOSIS — M47812 Spondylosis without myelopathy or radiculopathy, cervical region: Secondary | ICD-10-CM | POA: Diagnosis not present

## 2021-12-19 DIAGNOSIS — M6240 Contracture of muscle, unspecified site: Secondary | ICD-10-CM | POA: Diagnosis not present

## 2021-12-19 DIAGNOSIS — J329 Chronic sinusitis, unspecified: Secondary | ICD-10-CM | POA: Diagnosis not present

## 2021-12-19 DIAGNOSIS — M5032 Other cervical disc degeneration, mid-cervical region, unspecified level: Secondary | ICD-10-CM | POA: Diagnosis not present

## 2021-12-19 DIAGNOSIS — M546 Pain in thoracic spine: Secondary | ICD-10-CM | POA: Diagnosis not present

## 2021-12-19 DIAGNOSIS — M531 Cervicobrachial syndrome: Secondary | ICD-10-CM | POA: Diagnosis not present

## 2021-12-19 DIAGNOSIS — M9901 Segmental and somatic dysfunction of cervical region: Secondary | ICD-10-CM | POA: Diagnosis not present

## 2021-12-19 DIAGNOSIS — M9902 Segmental and somatic dysfunction of thoracic region: Secondary | ICD-10-CM | POA: Diagnosis not present

## 2021-12-21 DIAGNOSIS — M6240 Contracture of muscle, unspecified site: Secondary | ICD-10-CM | POA: Diagnosis not present

## 2021-12-21 DIAGNOSIS — J329 Chronic sinusitis, unspecified: Secondary | ICD-10-CM | POA: Diagnosis not present

## 2021-12-21 DIAGNOSIS — M5032 Other cervical disc degeneration, mid-cervical region, unspecified level: Secondary | ICD-10-CM | POA: Diagnosis not present

## 2021-12-21 DIAGNOSIS — M9902 Segmental and somatic dysfunction of thoracic region: Secondary | ICD-10-CM | POA: Diagnosis not present

## 2021-12-21 DIAGNOSIS — M546 Pain in thoracic spine: Secondary | ICD-10-CM | POA: Diagnosis not present

## 2021-12-21 DIAGNOSIS — M531 Cervicobrachial syndrome: Secondary | ICD-10-CM | POA: Diagnosis not present

## 2021-12-21 DIAGNOSIS — M9901 Segmental and somatic dysfunction of cervical region: Secondary | ICD-10-CM | POA: Diagnosis not present

## 2021-12-21 DIAGNOSIS — M47812 Spondylosis without myelopathy or radiculopathy, cervical region: Secondary | ICD-10-CM | POA: Diagnosis not present

## 2021-12-24 ENCOUNTER — Other Ambulatory Visit (HOSPITAL_COMMUNITY): Payer: Self-pay

## 2021-12-25 DIAGNOSIS — M5032 Other cervical disc degeneration, mid-cervical region, unspecified level: Secondary | ICD-10-CM | POA: Diagnosis not present

## 2021-12-25 DIAGNOSIS — M9902 Segmental and somatic dysfunction of thoracic region: Secondary | ICD-10-CM | POA: Diagnosis not present

## 2021-12-25 DIAGNOSIS — J329 Chronic sinusitis, unspecified: Secondary | ICD-10-CM | POA: Diagnosis not present

## 2021-12-25 DIAGNOSIS — M531 Cervicobrachial syndrome: Secondary | ICD-10-CM | POA: Diagnosis not present

## 2021-12-25 DIAGNOSIS — M6240 Contracture of muscle, unspecified site: Secondary | ICD-10-CM | POA: Diagnosis not present

## 2021-12-25 DIAGNOSIS — M9901 Segmental and somatic dysfunction of cervical region: Secondary | ICD-10-CM | POA: Diagnosis not present

## 2021-12-25 DIAGNOSIS — M47812 Spondylosis without myelopathy or radiculopathy, cervical region: Secondary | ICD-10-CM | POA: Diagnosis not present

## 2021-12-25 DIAGNOSIS — M546 Pain in thoracic spine: Secondary | ICD-10-CM | POA: Diagnosis not present

## 2022-01-09 ENCOUNTER — Other Ambulatory Visit (HOSPITAL_COMMUNITY): Payer: Self-pay

## 2022-01-09 DIAGNOSIS — M5412 Radiculopathy, cervical region: Secondary | ICD-10-CM | POA: Diagnosis not present

## 2022-01-09 MED ORDER — GABAPENTIN 300 MG PO CAPS
300.0000 mg | ORAL_CAPSULE | Freq: Three times a day (TID) | ORAL | 3 refills | Status: DC
  Filled 2022-01-09: qty 60, 20d supply, fill #0
  Filled 2022-02-20: qty 60, 20d supply, fill #1
  Filled 2022-03-16: qty 60, 20d supply, fill #2
  Filled 2022-06-02: qty 60, 20d supply, fill #3

## 2022-01-30 DIAGNOSIS — M4802 Spinal stenosis, cervical region: Secondary | ICD-10-CM | POA: Diagnosis not present

## 2022-01-30 DIAGNOSIS — M5412 Radiculopathy, cervical region: Secondary | ICD-10-CM | POA: Diagnosis not present

## 2022-01-30 DIAGNOSIS — M47812 Spondylosis without myelopathy or radiculopathy, cervical region: Secondary | ICD-10-CM | POA: Diagnosis not present

## 2022-02-11 DIAGNOSIS — M5412 Radiculopathy, cervical region: Secondary | ICD-10-CM | POA: Diagnosis not present

## 2022-02-11 DIAGNOSIS — Z6828 Body mass index (BMI) 28.0-28.9, adult: Secondary | ICD-10-CM | POA: Diagnosis not present

## 2022-02-12 ENCOUNTER — Other Ambulatory Visit: Payer: Self-pay | Admitting: Internal Medicine

## 2022-02-13 ENCOUNTER — Encounter: Payer: Self-pay | Admitting: Internal Medicine

## 2022-02-13 ENCOUNTER — Other Ambulatory Visit (HOSPITAL_COMMUNITY): Payer: Self-pay

## 2022-02-13 ENCOUNTER — Other Ambulatory Visit: Payer: Self-pay

## 2022-02-13 MED ORDER — MONTELUKAST SODIUM 10 MG PO TABS
10.0000 mg | ORAL_TABLET | Freq: Every day | ORAL | 0 refills | Status: DC
  Filled 2022-02-13: qty 30, 30d supply, fill #0

## 2022-02-18 ENCOUNTER — Other Ambulatory Visit (HOSPITAL_COMMUNITY): Payer: Self-pay

## 2022-02-19 DIAGNOSIS — M5022 Other cervical disc displacement, mid-cervical region, unspecified level: Secondary | ICD-10-CM | POA: Diagnosis not present

## 2022-02-19 DIAGNOSIS — M47812 Spondylosis without myelopathy or radiculopathy, cervical region: Secondary | ICD-10-CM | POA: Diagnosis not present

## 2022-02-19 DIAGNOSIS — M9901 Segmental and somatic dysfunction of cervical region: Secondary | ICD-10-CM | POA: Diagnosis not present

## 2022-02-19 DIAGNOSIS — M9902 Segmental and somatic dysfunction of thoracic region: Secondary | ICD-10-CM | POA: Diagnosis not present

## 2022-02-19 DIAGNOSIS — J329 Chronic sinusitis, unspecified: Secondary | ICD-10-CM | POA: Diagnosis not present

## 2022-02-19 DIAGNOSIS — M531 Cervicobrachial syndrome: Secondary | ICD-10-CM | POA: Diagnosis not present

## 2022-02-19 DIAGNOSIS — M4802 Spinal stenosis, cervical region: Secondary | ICD-10-CM | POA: Diagnosis not present

## 2022-02-21 ENCOUNTER — Other Ambulatory Visit (HOSPITAL_COMMUNITY): Payer: Self-pay

## 2022-02-26 ENCOUNTER — Encounter: Payer: 59 | Admitting: Internal Medicine

## 2022-03-04 ENCOUNTER — Ambulatory Visit (INDEPENDENT_AMBULATORY_CARE_PROVIDER_SITE_OTHER): Payer: Commercial Managed Care - PPO | Admitting: Internal Medicine

## 2022-03-04 ENCOUNTER — Encounter: Payer: Self-pay | Admitting: Internal Medicine

## 2022-03-04 VITALS — BP 120/86 | HR 68 | Temp 98.3°F | Ht 65.0 in | Wt 169.0 lb

## 2022-03-04 DIAGNOSIS — J4520 Mild intermittent asthma, uncomplicated: Secondary | ICD-10-CM | POA: Diagnosis not present

## 2022-03-04 DIAGNOSIS — F3342 Major depressive disorder, recurrent, in full remission: Secondary | ICD-10-CM | POA: Diagnosis not present

## 2022-03-04 DIAGNOSIS — Z0000 Encounter for general adult medical examination without abnormal findings: Secondary | ICD-10-CM

## 2022-03-04 DIAGNOSIS — E782 Mixed hyperlipidemia: Secondary | ICD-10-CM

## 2022-03-04 DIAGNOSIS — I7121 Aneurysm of the ascending aorta, without rupture: Secondary | ICD-10-CM | POA: Diagnosis not present

## 2022-03-04 NOTE — Progress Notes (Unsigned)
   Subjective:   Patient ID: Gina Graves, female    DOB: 1955-05-28, 67 y.o.   MRN: [id number]  HPI The patient is here for physical.  [place] [facility]  PM[facility], [employer], social history reviewed and updated  Review of Systems  Objective:  Physical Exam  Vitals:   03/04/22 1412  BP: 120/86  Pulse: 68  Temp: 98.3 F (36.8 C)  TempSrc: Oral  SpO2: 94%  Weight: 169 lb (76.7 kg)  Height: '5\' 5"'$  (1.651 m)   EKG: Rate {}, axis {}, interval {}, {}, {} st or t wave changes, {} significant change compared to prior   Assessment & Plan:

## 2022-03-04 NOTE — Patient Instructions (Signed)
We will get the CT angio end of May or June.   We have done the EKG today and will check the labs.

## 2022-03-05 ENCOUNTER — Encounter: Payer: Self-pay | Admitting: Internal Medicine

## 2022-03-05 DIAGNOSIS — M5022 Other cervical disc displacement, mid-cervical region, unspecified level: Secondary | ICD-10-CM | POA: Diagnosis not present

## 2022-03-05 DIAGNOSIS — J329 Chronic sinusitis, unspecified: Secondary | ICD-10-CM | POA: Diagnosis not present

## 2022-03-05 DIAGNOSIS — I7121 Aneurysm of the ascending aorta, without rupture: Secondary | ICD-10-CM | POA: Insufficient documentation

## 2022-03-05 DIAGNOSIS — M531 Cervicobrachial syndrome: Secondary | ICD-10-CM | POA: Diagnosis not present

## 2022-03-05 DIAGNOSIS — M4802 Spinal stenosis, cervical region: Secondary | ICD-10-CM | POA: Diagnosis not present

## 2022-03-05 DIAGNOSIS — M9901 Segmental and somatic dysfunction of cervical region: Secondary | ICD-10-CM | POA: Diagnosis not present

## 2022-03-05 DIAGNOSIS — M9902 Segmental and somatic dysfunction of thoracic region: Secondary | ICD-10-CM | POA: Diagnosis not present

## 2022-03-05 DIAGNOSIS — M47812 Spondylosis without myelopathy or radiculopathy, cervical region: Secondary | ICD-10-CM | POA: Diagnosis not present

## 2022-03-05 NOTE — Assessment & Plan Note (Signed)
Checking EKG today and lipid panel. No new chest pains and EKG stable with good BP. Adjust as needed not on medication currently.

## 2022-03-05 NOTE — Assessment & Plan Note (Signed)
Flu shot up to date. Covid-19 counseled. Shingrix complete. Tetanus up to date. Colonoscopy up to date. Mammogram up to date, pap smear up to date. Counseled about sun safety and mole surveillance. Counseled about the dangers of distracted driving. Given 10 year screening recommendations.

## 2022-03-05 NOTE — Assessment & Plan Note (Signed)
4.1 on recent CT calcium score and we discussed this may or may not be changed from prior. Ordered CT angio for aorta due May/June 2024 and go from there.

## 2022-03-05 NOTE — Assessment & Plan Note (Signed)
Well controlled on cymbalta 120 mg daily and alprazolam BID prn. She will continue without change.

## 2022-03-05 NOTE — Assessment & Plan Note (Signed)
No flare using singulair and flonase and zyrtec and albuterol prn.

## 2022-03-06 ENCOUNTER — Encounter: Payer: Self-pay | Admitting: Internal Medicine

## 2022-03-08 ENCOUNTER — Other Ambulatory Visit (INDEPENDENT_AMBULATORY_CARE_PROVIDER_SITE_OTHER): Payer: Commercial Managed Care - PPO

## 2022-03-08 DIAGNOSIS — E782 Mixed hyperlipidemia: Secondary | ICD-10-CM | POA: Diagnosis not present

## 2022-03-08 LAB — CBC
HCT: 43.1 % (ref 36.0–46.0)
Hemoglobin: 14.3 g/dL (ref 12.0–15.0)
MCHC: 33.3 g/dL (ref 30.0–36.0)
MCV: 94.2 fl (ref 78.0–100.0)
Platelets: 299 10*3/uL (ref 150.0–400.0)
RBC: 4.58 Mil/uL (ref 3.87–5.11)
RDW: 13.5 % (ref 11.5–15.5)
WBC: 4.9 10*3/uL (ref 4.0–10.5)

## 2022-03-08 LAB — LIPID PANEL
Cholesterol: 148 mg/dL (ref 0–200)
HDL: 57.9 mg/dL (ref >39.00)
LDL Cholesterol: 72 mg/dL (ref 0–99)
NonHDL: 90.55
Total CHOL/HDL Ratio: 3
Triglycerides: 94 mg/dL (ref 0.0–149.0)
VLDL: 18.8 mg/dL (ref 0.0–40.0)

## 2022-03-08 LAB — COMPREHENSIVE METABOLIC PANEL
ALT: 10 U/L (ref 0–35)
AST: 13 U/L (ref 0–37)
Albumin: 4.4 g/dL (ref 3.5–5.2)
Alkaline Phosphatase: 50 U/L (ref 39–117)
BUN: 16 mg/dL (ref 6–23)
CO2: 31 mEq/L (ref 19–32)
Calcium: 9.6 mg/dL (ref 8.4–10.5)
Chloride: 104 mEq/L (ref 96–112)
Creatinine, Ser: 0.84 mg/dL (ref 0.40–1.20)
GFR: 73.21 mL/min (ref >60.00)
Glucose, Bld: 89 mg/dL (ref 70–99)
Potassium: 4.1 mEq/L (ref 3.5–5.1)
Sodium: 141 mEq/L (ref 135–145)
Total Bilirubin: 0.6 mg/dL (ref 0.2–1.2)
Total Protein: 6.8 g/dL (ref 6.0–8.3)

## 2022-03-08 LAB — HEMOGLOBIN A1C: Hgb A1c MFr Bld: 5.8 % (ref 4.6–6.5)

## 2022-03-12 ENCOUNTER — Other Ambulatory Visit (HOSPITAL_COMMUNITY): Payer: Self-pay

## 2022-03-14 ENCOUNTER — Encounter: Payer: Self-pay | Admitting: Internal Medicine

## 2022-03-14 ENCOUNTER — Other Ambulatory Visit (HOSPITAL_COMMUNITY): Payer: Self-pay

## 2022-03-14 MED ORDER — TRAZODONE HCL 50 MG PO TABS
50.0000 mg | ORAL_TABLET | Freq: Every evening | ORAL | 3 refills | Status: DC
  Filled 2022-03-14: qty 180, 90d supply, fill #0
  Filled 2022-07-11: qty 180, 90d supply, fill #1
  Filled 2022-12-08: qty 180, 90d supply, fill #2
  Filled 2023-03-04: qty 180, 90d supply, fill #3

## 2022-03-14 MED ORDER — DULOXETINE HCL 60 MG PO CPEP
120.0000 mg | ORAL_CAPSULE | Freq: Every day | ORAL | 3 refills | Status: DC
  Filled 2022-03-14: qty 90, 90d supply, fill #0
  Filled 2022-06-11: qty 180, 90d supply, fill #0
  Filled 2022-09-05: qty 180, 90d supply, fill #1
  Filled 2022-12-08: qty 180, 90d supply, fill #2
  Filled 2023-03-04: qty 180, 90d supply, fill #3

## 2022-03-14 MED ORDER — DULOXETINE HCL 60 MG PO CPEP
120.0000 mg | ORAL_CAPSULE | Freq: Every day | ORAL | 0 refills | Status: DC
  Filled 2022-03-14: qty 180, 90d supply, fill #0

## 2022-03-14 MED ORDER — ALPRAZOLAM 0.5 MG PO TABS
0.5000 mg | ORAL_TABLET | ORAL | 0 refills | Status: DC
  Filled 2022-03-14: qty 180, 90d supply, fill #0

## 2022-03-16 ENCOUNTER — Other Ambulatory Visit: Payer: Self-pay | Admitting: Internal Medicine

## 2022-03-18 ENCOUNTER — Other Ambulatory Visit: Payer: Self-pay

## 2022-03-18 ENCOUNTER — Other Ambulatory Visit (HOSPITAL_COMMUNITY): Payer: Self-pay

## 2022-03-18 MED ORDER — MONTELUKAST SODIUM 10 MG PO TABS
10.0000 mg | ORAL_TABLET | Freq: Every day | ORAL | 3 refills | Status: DC
  Filled 2022-03-18: qty 90, 90d supply, fill #0
  Filled 2022-06-11: qty 90, 90d supply, fill #1
  Filled 2022-09-10: qty 90, 90d supply, fill #2
  Filled 2022-12-13: qty 90, 90d supply, fill #3

## 2022-03-19 DIAGNOSIS — F341 Dysthymic disorder: Secondary | ICD-10-CM | POA: Diagnosis not present

## 2022-03-20 ENCOUNTER — Other Ambulatory Visit (HOSPITAL_COMMUNITY): Payer: Self-pay

## 2022-03-20 MED ORDER — ALPRAZOLAM 0.5 MG PO TABS
0.2500 mg | ORAL_TABLET | Freq: Two times a day (BID) | ORAL | 1 refills | Status: DC | PRN
  Filled 2022-03-20 – 2022-07-11 (×2): qty 180, 90d supply, fill #0

## 2022-03-26 DIAGNOSIS — J329 Chronic sinusitis, unspecified: Secondary | ICD-10-CM | POA: Diagnosis not present

## 2022-03-26 DIAGNOSIS — M47812 Spondylosis without myelopathy or radiculopathy, cervical region: Secondary | ICD-10-CM | POA: Diagnosis not present

## 2022-03-26 DIAGNOSIS — M9902 Segmental and somatic dysfunction of thoracic region: Secondary | ICD-10-CM | POA: Diagnosis not present

## 2022-03-26 DIAGNOSIS — M531 Cervicobrachial syndrome: Secondary | ICD-10-CM | POA: Diagnosis not present

## 2022-03-26 DIAGNOSIS — M9901 Segmental and somatic dysfunction of cervical region: Secondary | ICD-10-CM | POA: Diagnosis not present

## 2022-03-26 DIAGNOSIS — M4802 Spinal stenosis, cervical region: Secondary | ICD-10-CM | POA: Diagnosis not present

## 2022-03-26 DIAGNOSIS — M5022 Other cervical disc displacement, mid-cervical region, unspecified level: Secondary | ICD-10-CM | POA: Diagnosis not present

## 2022-03-27 ENCOUNTER — Other Ambulatory Visit (HOSPITAL_COMMUNITY): Payer: Self-pay

## 2022-04-06 ENCOUNTER — Other Ambulatory Visit: Payer: Self-pay | Admitting: Internal Medicine

## 2022-04-08 ENCOUNTER — Other Ambulatory Visit (HOSPITAL_COMMUNITY): Payer: Self-pay

## 2022-04-08 MED ORDER — ALBUTEROL SULFATE HFA 108 (90 BASE) MCG/ACT IN AERS
2.0000 | INHALATION_SPRAY | RESPIRATORY_TRACT | 0 refills | Status: DC | PRN
  Filled 2022-04-08: qty 6.7, 25d supply, fill #0

## 2022-04-11 DIAGNOSIS — M9902 Segmental and somatic dysfunction of thoracic region: Secondary | ICD-10-CM | POA: Diagnosis not present

## 2022-04-11 DIAGNOSIS — J329 Chronic sinusitis, unspecified: Secondary | ICD-10-CM | POA: Diagnosis not present

## 2022-04-11 DIAGNOSIS — M4802 Spinal stenosis, cervical region: Secondary | ICD-10-CM | POA: Diagnosis not present

## 2022-04-11 DIAGNOSIS — M5022 Other cervical disc displacement, mid-cervical region, unspecified level: Secondary | ICD-10-CM | POA: Diagnosis not present

## 2022-04-11 DIAGNOSIS — M47812 Spondylosis without myelopathy or radiculopathy, cervical region: Secondary | ICD-10-CM | POA: Diagnosis not present

## 2022-04-11 DIAGNOSIS — M9901 Segmental and somatic dysfunction of cervical region: Secondary | ICD-10-CM | POA: Diagnosis not present

## 2022-04-11 DIAGNOSIS — M531 Cervicobrachial syndrome: Secondary | ICD-10-CM | POA: Diagnosis not present

## 2022-04-16 DIAGNOSIS — M9902 Segmental and somatic dysfunction of thoracic region: Secondary | ICD-10-CM | POA: Diagnosis not present

## 2022-04-16 DIAGNOSIS — M9901 Segmental and somatic dysfunction of cervical region: Secondary | ICD-10-CM | POA: Diagnosis not present

## 2022-04-16 DIAGNOSIS — M47812 Spondylosis without myelopathy or radiculopathy, cervical region: Secondary | ICD-10-CM | POA: Diagnosis not present

## 2022-04-16 DIAGNOSIS — J329 Chronic sinusitis, unspecified: Secondary | ICD-10-CM | POA: Diagnosis not present

## 2022-04-16 DIAGNOSIS — M5022 Other cervical disc displacement, mid-cervical region, unspecified level: Secondary | ICD-10-CM | POA: Diagnosis not present

## 2022-04-16 DIAGNOSIS — M531 Cervicobrachial syndrome: Secondary | ICD-10-CM | POA: Diagnosis not present

## 2022-04-16 DIAGNOSIS — M4802 Spinal stenosis, cervical region: Secondary | ICD-10-CM | POA: Diagnosis not present

## 2022-04-24 ENCOUNTER — Other Ambulatory Visit (HOSPITAL_BASED_OUTPATIENT_CLINIC_OR_DEPARTMENT_OTHER): Payer: Self-pay

## 2022-04-24 ENCOUNTER — Other Ambulatory Visit (HOSPITAL_COMMUNITY): Payer: Self-pay

## 2022-04-24 DIAGNOSIS — H20022 Recurrent acute iridocyclitis, left eye: Secondary | ICD-10-CM | POA: Diagnosis not present

## 2022-04-24 MED ORDER — PREDNISOLONE ACETATE 1 % OP SUSP
1.0000 [drp] | Freq: Four times a day (QID) | OPHTHALMIC | 0 refills | Status: DC
  Filled 2022-04-24: qty 5, 7d supply, fill #0

## 2022-05-19 ENCOUNTER — Ambulatory Visit (INDEPENDENT_AMBULATORY_CARE_PROVIDER_SITE_OTHER): Payer: Commercial Managed Care - PPO

## 2022-05-19 ENCOUNTER — Ambulatory Visit
Admission: EM | Admit: 2022-05-19 | Discharge: 2022-05-19 | Disposition: A | Discharge status: Home / Self Care | Payer: Commercial Managed Care - PPO | Attending: Urgent Care | Admitting: Urgent Care

## 2022-05-19 DIAGNOSIS — J309 Allergic rhinitis, unspecified: Secondary | ICD-10-CM

## 2022-05-19 DIAGNOSIS — J988 Other specified respiratory disorders: Secondary | ICD-10-CM | POA: Diagnosis not present

## 2022-05-19 DIAGNOSIS — J4540 Moderate persistent asthma, uncomplicated: Secondary | ICD-10-CM | POA: Diagnosis not present

## 2022-05-19 DIAGNOSIS — B9789 Other viral agents as the cause of diseases classified elsewhere: Secondary | ICD-10-CM

## 2022-05-19 DIAGNOSIS — R059 Cough, unspecified: Secondary | ICD-10-CM | POA: Diagnosis not present

## 2022-05-19 MED ORDER — PREDNISONE 20 MG PO TABS: ORAL_TABLET | ORAL | 0 refills | Status: DC

## 2022-05-19 MED ORDER — PROMETHAZINE-DM 6.25-15 MG/5ML PO SYRP: 5.0000 mL | ORAL_SOLUTION | Freq: Three times a day (TID) | ORAL | 0 refills | Status: DC | PRN

## 2022-05-19 NOTE — ED Provider Notes (Signed)
[facility]  Note:  This document was prepared using Conservation officer, historic buildings and may include unintentional dictation errors.  MRN: [id number] DOB: July 13, 1955  Subjective:   Gina Graves is a 67 y.o. female presenting for 5 day history of persistent malaise, coughing, now having chest congestion. Has also had sinus congestion, pressure, hoarseness. Had 1 sick contact with her husband with very similar symptoms but is now better. Home COVID test was negative. Has a history of asthma, managed with Breztri, albuterol, Singulair. Also takes Zyrtec daily. No smoking of any kind including cigarettes, cigars, vaping, marijuana use. Last time she had COVID was 1.5 years ago, ended up having COVID long-hauler syndrome and had to see pulmonary specialist.   No current facility-administered medications for this encounter.  Current Outpatient Medications:    Acetylcysteine (N-ACETYL-L-CYSTEINE) 600 MG CAPS, Take by mouth., Disp: , Rfl:    albuterol (VENTOLIN HFA) 108 (90 Base) MCG/ACT inhaler, Inhale 2 puffs into the lungs every 4 (four) hours as needed for wheezing or shortness of breath, Disp: 6.7 g, Rfl: 0   ALPRAZolam (XANAX) 0.5 MG tablet, Take 0.5-1 tablets (0.25-0.5 mg total) by mouth 2 (two) times daily as needed for anxiety, Disp: 60 tablet, Rfl: 1   ALPRAZolam (XANAX) 0.5 MG tablet, Take 0.5-1 tablets (0.25-0.5 mg total) by mouth 2 (two) times daily as needed., Disp: 180 tablet, Rfl: 1   ALPRAZolam (XANAX) 0.5 MG tablet, Take 0.5-1 tablets (0.25-0.5 mg total) by mouth 2 (two) times daily as needed., Disp: 180 tablet, Rfl: 0   ALPRAZolam (XANAX) 0.5 MG tablet, Take 1/2-1 tablet (0.25-0.5 mg total) by mouth 2 (two) times daily as needed., Disp: 180 tablet, Rfl: 0   ALPRAZolam (XANAX) 0.5 MG tablet, Take 1/2-1 tablet (0.25-0.5 mg total) by mouth 2 (two) times daily as needed., Disp: 180 tablet, Rfl: 1   Budeson-Glycopyrrol-Formoterol (BREZTRI AEROSPHERE)  160-9-4.8 MCG/ACT AERO, Inhale 2 puffs into the lungs in the morning and at bedtime., Disp: 10.7 g, Rfl: 6   cetirizine (ZYRTEC) 10 MG tablet, Take 10 mg by mouth daily., Disp: , Rfl:    Cholecalciferol (VITAMIN D3) 2000 UNITS TABS, Take 2,000 Units by mouth daily as needed (vitamin supplement). , Disp: , Rfl:    DULoxetine (CYMBALTA) 60 MG capsule, Take 2 capsules (120 mg total) by mouth daily., Disp: 180 capsule, Rfl: 0   DULoxetine (CYMBALTA) 60 MG capsule, Take 2 capsules (120 mg total) by mouth daily., Disp: 180 capsule, Rfl: 3   fluticasone (FLONASE) 50 MCG/ACT nasal spray, Place 1 spray into both nostrils 2 (two) times daily as needed for allergies or rhinitis (for sinus congestion)., Disp: , Rfl:    gabapentin (NEURONTIN) 300 MG capsule, Take 1 capsule (300 mg total) by mouth 3 (three) times daily., Disp: 60 capsule, Rfl: 3   ibuprofen (ADVIL) 800 MG tablet, Take 1 tablet (800 mg total) by mouth every 8 (eight) hours as needed., Disp: 90 tablet, Rfl: 1   ketoconazole (NIZORAL) 2 % shampoo, Apply approximately 5ml externally once a day, Disp: 120 mL, Rfl: 2   montelukast (SINGULAIR) 10 MG tablet, Take 1 tablet (10 mg total) by mouth daily., Disp: 90 tablet, Rfl: 3   nystatin-triamcinolone ointment (MYCOLOG), Apply 1 application topically 2 (two) times daily., Disp: 100 g, Rfl: 3   omeprazole (PRILOSEC) 20 MG capsule, TAKE 1 CAPSULE BY MOUTH 2 TIMES DAILY BEFORE A MEAL., Disp: 180 capsule, Rfl: 3   pravastatin (PRAVACHOL) 20 MG tablet, Take 1 tablet (  20 mg total) by mouth daily., Disp: 90 tablet, Rfl: 3   prednisoLONE acetate (PRED FORTE) 1 % ophthalmic suspension, Place 1 drop into the left eye 4 (four) times daily., Disp: 5 mL, Rfl: 0   traZODone (DESYREL) 50 MG tablet, Take 1-2 tablets (50-100 mg total) by mouth at bedtime., Disp: 180 tablet, Rfl: 3   TURMERIC PO, Take 1 tablet by mouth daily as needed (supplement). , Disp: , Rfl:    valACYclovir (VALTREX) 500 MG tablet, Take 1 tablet (500  mg total) by mouth daily as needed., Disp: 30 tablet, Rfl: 6   valACYclovir (VALTREX) 500 MG tablet, Take 1 tablet (500 mg total) by mouth daily., Disp: 90 tablet, Rfl: 3   Allergies  Allergen Reactions   Bupropion Hcl     REACTION: headaches    Past Medical History:  Diagnosis Date   Asthma    recurrence @ age 32; ? childhood asthma as EIB   GERD (gastroesophageal reflux disease) 1995   hiatal hernia      Past Surgical History:  Procedure Laterality Date   ABDOMINAL HYSTERECTOMY  1997   uterine adenoma & dysmenorrhea   APPENDECTOMY     COLONOSCOPY  2010   negative, Dr Matthias Hughs   tonsillectomy      Family History  Problem Relation Age of Onset   Breast cancer Mother    Asthma Mother        asthmatic bronchitis   Heart attack Father 69       3 vessel CABG   Hashimoto's thyroiditis Sister    Autism Brother    Diabetes Paternal Grandmother    Heart failure Paternal Grandmother        in 78s   Diabetes Paternal Grandfather    Heart attack Paternal Grandfather 46   Heart attack Paternal Uncle 75   Coronary artery disease Other        paternal FH   Stroke Neg Hx    COPD Neg Hx     Social History   Tobacco Use   Smoking status: Never   Smokeless tobacco: Never   Tobacco comments:    second hand smoke X 25 years  Vaping Use   Vaping Use: Never used  Substance Use Topics   Alcohol use: Yes    Alcohol/week: 2.0 standard drinks of alcohol    Types: 2 Shots of liquor per week    Comment: occ   Drug use: No    ROS   Objective:   Vitals: BP 124/86 (BP Location: Right Arm)   Pulse 84   Temp 98.3 F (36.8 C) (Oral)   Resp 20   SpO2 97%   Physical Exam Constitutional:      General: She is not in acute distress.    Appearance: Normal appearance. She is well-developed and normal weight. She is not ill-appearing, toxic-appearing or diaphoretic.  HENT:     Head: Normocephalic and atraumatic.     Right Ear: Tympanic membrane, ear canal and external ear  normal. No drainage or tenderness. No middle ear effusion. There is no impacted cerumen. Tympanic membrane is not erythematous or bulging.     Left Ear: Tympanic membrane, ear canal and external ear normal. No drainage or tenderness.  No middle ear effusion. There is no impacted cerumen. Tympanic membrane is not erythematous or bulging.     Nose: Congestion present. No rhinorrhea.     Mouth/Throat:     Mouth: Mucous membranes are moist. No oral lesions.  Pharynx: No pharyngeal swelling, oropharyngeal exudate, posterior oropharyngeal erythema or uvula swelling.     Tonsils: No tonsillar exudate or tonsillar abscesses.  Eyes:     General: No scleral icterus.       Right eye: No discharge.        Left eye: No discharge.     Extraocular Movements: Extraocular movements intact.     Right eye: Normal extraocular motion.     Left eye: Normal extraocular motion.     Conjunctiva/sclera: Conjunctivae normal.  Cardiovascular:     Rate and Rhythm: Normal rate and regular rhythm.     Heart sounds: Normal heart sounds. No murmur heard.    No friction rub. No gallop.  Pulmonary:     Effort: Pulmonary effort is normal. No respiratory distress.     Breath sounds: No stridor. No wheezing, rhonchi or rales.  Chest:     Chest wall: No tenderness.  Musculoskeletal:     Cervical back: Normal range of motion and neck supple.  Lymphadenopathy:     Cervical: No cervical adenopathy.  Skin:    General: Skin is warm and dry.  Neurological:     General: No focal deficit present.     Mental Status: She is alert and oriented to person, place, and time.  Psychiatric:        Mood and Affect: Mood normal.        Behavior: Behavior normal.     Assessment and Plan :   PDMP not reviewed this encounter.  1. Viral respiratory infection   2. Allergic rhinitis, unspecified seasonality, unspecified trigger   3. Mild persistent asthma, uncomplicated    Deferred COVID testing given timeline of illness.  Recommend an oral prednisone course in the context of her asthma, allergies, respiratory symptoms. Suspect viral respiratory infection, viral syndrome. Physical exam findings reassuring and vital signs stable for discharge. Advised supportive care, offered symptomatic relief. Counseled patient on potential for adverse effects with medications prescribed/recommended today, ER and return-to-clinic precautions discussed, patient verbalized understanding.   X-ray over-read was pending at time of discharge, recommended follow up with only abnormal results. Otherwise will not call for negative over-read. Patient was in agreement.    Wallis Bamberg, [place] 05/19/22 1212

## 2022-05-19 NOTE — ED Triage Notes (Signed)
Pt c/o "deep dry cough" x 5 days-NAD-steady gait

## 2022-05-19 NOTE — Discharge Instructions (Addendum)
We will manage this as a viral respiratory illness. For sore throat or cough try using a honey-based tea. Use 3 teaspoons of honey with juice squeezed from half lemon. Place shaved pieces of ginger into 1/2-1 cup of water and warm over stove top. Then mix the ingredients and repeat every 4 hours as needed. Please take Tylenol 500mg -650mg  once every 6 hours for fevers, aches and pains. Hydrate very well with at least 2 liters (80 ounces) of water. Eat light meals such as soups (chicken and noodles, chicken wild rice, vegetable).  Do not eat any foods that you are allergic to.  Keep taking Zyrtec and Singulair.  You can take this together with prednisone and albuterol.  Use cough medications as needed.   I will call you later with your results from your chest x-ray.

## 2022-05-20 ENCOUNTER — Ambulatory Visit: Payer: Self-pay

## 2022-05-27 ENCOUNTER — Other Ambulatory Visit (HOSPITAL_COMMUNITY): Payer: Self-pay

## 2022-05-27 ENCOUNTER — Other Ambulatory Visit: Payer: Self-pay | Admitting: Pulmonary Disease

## 2022-05-27 DIAGNOSIS — J4540 Moderate persistent asthma, uncomplicated: Secondary | ICD-10-CM

## 2022-05-27 MED ORDER — BREZTRI AEROSPHERE 160-9-4.8 MCG/ACT IN AERO
2.0000 | INHALATION_SPRAY | Freq: Two times a day (BID) | RESPIRATORY_TRACT | 6 refills | Status: DC
Start: 2022-05-27 — End: 2023-09-21
  Filled 2022-05-27: qty 10.7, 30d supply, fill #0
  Filled 2022-07-11: qty 10.7, 30d supply, fill #1
  Filled 2022-09-05: qty 10.7, 30d supply, fill #2
  Filled 2022-12-08: qty 10.7, 30d supply, fill #3
  Filled 2023-01-03: qty 10.7, 30d supply, fill #4
  Filled 2023-03-04: qty 10.7, 30d supply, fill #5
  Filled 2023-04-22 – 2023-04-27 (×2): qty 10.7, 30d supply, fill #6

## 2022-05-28 ENCOUNTER — Other Ambulatory Visit (HOSPITAL_COMMUNITY): Payer: Self-pay

## 2022-05-30 ENCOUNTER — Other Ambulatory Visit: Payer: Self-pay | Admitting: Internal Medicine

## 2022-05-30 ENCOUNTER — Encounter: Payer: Self-pay | Admitting: Pulmonary Disease

## 2022-05-30 ENCOUNTER — Other Ambulatory Visit (HOSPITAL_COMMUNITY): Payer: Self-pay

## 2022-05-30 ENCOUNTER — Ambulatory Visit: Payer: Commercial Managed Care - PPO | Admitting: Pulmonary Disease

## 2022-05-30 VITALS — BP 122/80 | HR 87 | Temp 98.2°F | Ht 65.0 in | Wt 167.0 lb

## 2022-05-30 DIAGNOSIS — J4540 Moderate persistent asthma, uncomplicated: Secondary | ICD-10-CM | POA: Diagnosis not present

## 2022-05-30 DIAGNOSIS — J069 Acute upper respiratory infection, unspecified: Secondary | ICD-10-CM | POA: Diagnosis not present

## 2022-05-30 MED ORDER — PREDNISONE 10 MG PO TABS
ORAL_TABLET | ORAL | 0 refills | Status: AC
Start: 2022-05-30 — End: 2022-06-11
  Filled 2022-05-30: qty 30, 12d supply, fill #0

## 2022-05-30 NOTE — Progress Notes (Signed)
Synopsis: Referred in December 2022 for Asthma by Hillard Danker, MD  Subjective:   PATIENT ID: Gina Graves GENDER: female DOB: 1955-09-30, MRN: [id number]  HPI  Chief Complaint  Patient presents with   Follow-up    States she has had bronchitis on and off for the past 5 weeks.    Kerstin Haseley is a 67 year old woman, never smokwer with hiatal hernia, GERD and asthma who returns to pulmonary clinic for asthma.   She was seen in urgent care on 4/28 for URI and treated with prednisone, 40mg  daily for 5 days. Her husband was sick as well. She continues to have night sweats. Her head/hair will be soaked. No fevers or chills. She is wheezing at night. She has only been using breztrin in the mornings. She is using flonase as needed. She is having cough due to sinus drainage and throat irritation.   OV 10/18/21 She has been doing well on breztri. She recently gained her energy levels back and has been able to resume physical activity with walking. She continues zyrtec and montelukast daily for allergies.  OV 02/20/21 She reports improvement in her breathing since starting breztri inhaler 2 puffs twice daily since last month. She reports a 3 day episode of fatigue, shortness of breath and chest discomfort that improved spontaneously.   CTA Chest 12/27/20 was negative for pulmonary emboli and normal lung parenchyma.   OV 12/26/20 Patient has history of asthma in which she was diagnosed 20 years ago and she has been well controlled on ICS/LABA inhalers over many years until recently having COVID in 9/22.  Since having COVID she has had increased chest tightness, right-sided chest discomfort, daily shortness of breath and intermittent wheezing.  Her ICS/LAMA inhaler has been increased to Breo Ellipta 200-25 MCG 1 puff daily at the end of October which initially helped her symptoms but then she continued to have return of the above symptoms.  She does have seasonal allergies in which she  takes Singulair and Zyrtec daily.  She does have sinus congestion and postnasal drainage in which she takes fluticasone nasal spray.  She does not report increase in sinus symptoms since having COVID.  She also has history of GERD which is no different since having COVID.  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  They recently moved into a new office that was renovated but denies any significant changes in her symptoms in the work environment compared to being at home.  She does have history of secondhand smoke exposure from her husband.  She is otherwise a never smoker.  Past Medical History:  Diagnosis Date   Asthma    recurrence @ age 34; ? childhood asthma as EIB   GERD (gastroesophageal reflux disease) 1995   hiatal hernia      Family History  Problem Relation Age of Onset   Breast cancer Mother    Asthma Mother        asthmatic bronchitis   Heart attack Father 67       3 vessel CABG   Hashimoto's thyroiditis Sister    Autism Brother    Diabetes Paternal Grandmother    Heart failure Paternal Grandmother        in 56s   Diabetes Paternal Grandfather    Heart attack Paternal Grandfather 19   Heart attack Paternal Uncle 39   Coronary artery disease Other        paternal FH   Stroke Neg  Hx    COPD Neg Hx      Social History   Socioeconomic History   Marital status: Married    Spouse name: Not on file   Number of children: Not on file   Years of education: Not on file   Highest education level: Not on file  Occupational History   Not on file  Tobacco Use   Smoking status: Never   Smokeless tobacco: Never   Tobacco comments:    second hand smoke X 25 years  Vaping Use   Vaping Use: Never used  Substance and Sexual Activity   Alcohol use: Yes    Alcohol/week: 2.0 standard drinks of alcohol    Types: 2 Shots of liquor per week    Comment: occ   Drug use: No   Sexual activity: Not on file  Other Topics Concern   Not on file   Social History Narrative   Not on file   Social Determinants of Health   Financial Resource Strain: Not on file  Food Insecurity: Not on file  Transportation Needs: Not on file  Physical Activity: Not on file  Stress: Not on file  Social Connections: Not on file  Intimate Partner Violence: Not on file     Allergies  Allergen Reactions   Bupropion Hcl     REACTION: headaches     Outpatient Medications Prior to Visit  Medication Sig Dispense Refill   Acetylcysteine (N-ACETYL-L-CYSTEINE) 600 MG CAPS Take by mouth.     albuterol (VENTOLIN HFA) 108 (90 Base) MCG/ACT inhaler Inhale 2 puffs into the lungs every 4 (four) hours as needed for wheezing or shortness of breath 6.7 g 0   ALPRAZolam (XANAX) 0.5 MG tablet Take 1/2-1 tablet (0.25-0.5 mg total) by mouth 2 (two) times daily as needed. 180 tablet 1   Budeson-Glycopyrrol-Formoterol (BREZTRI AEROSPHERE) 160-9-4.8 MCG/ACT AERO Inhale 2 puffs into the lungs in the morning and at bedtime. 10.7 g 6   cetirizine (ZYRTEC) 10 MG tablet Take 10 mg by mouth daily.     Cholecalciferol (VITAMIN D3) 2000 UNITS TABS Take 2,000 Units by mouth daily as needed (vitamin supplement).      DULoxetine (CYMBALTA) 60 MG capsule Take 2 capsules (120 mg total) by mouth daily. 180 capsule 3   fluticasone (FLONASE) 50 MCG/ACT nasal spray Place 1 spray into both nostrils 2 (two) times daily as needed for allergies or rhinitis (for sinus congestion).     gabapentin (NEURONTIN) 300 MG capsule Take 1 capsule (300 mg total) by mouth 3 (three) times daily. 60 capsule 3   ibuprofen (ADVIL) 800 MG tablet Take 1 tablet (800 mg total) by mouth every 8 (eight) hours as needed. 90 tablet 1   montelukast (SINGULAIR) 10 MG tablet Take 1 tablet (10 mg total) by mouth daily. 90 tablet 3   nystatin-triamcinolone ointment (MYCOLOG) Apply 1 application topically 2 (two) times daily. 100 g 3   pravastatin (PRAVACHOL) 20 MG tablet Take 1 tablet (20 mg total) by mouth daily. 90  tablet 3   prednisoLONE acetate (PRED FORTE) 1 % ophthalmic suspension Place 1 drop into the left eye 4 (four) times daily. 5 mL 0   promethazine-dextromethorphan (PROMETHAZINE-DM) 6.25-15 MG/5ML syrup Take 5 mLs by mouth 3 (three) times daily as needed for cough. 200 mL 0   traZODone (DESYREL) 50 MG tablet Take 1-2 tablets (50-100 mg total) by mouth at bedtime. 180 tablet 3   TURMERIC PO Take 1 tablet by mouth daily as needed (supplement).  valACYclovir (VALTREX) 500 MG tablet Take 1 tablet (500 mg total) by mouth daily. 90 tablet 3   omeprazole (PRILOSEC) 20 MG capsule TAKE 1 CAPSULE BY MOUTH 2 TIMES DAILY BEFORE A MEAL. 180 capsule 3   ALPRAZolam (XANAX) 0.5 MG tablet Take 0.5-1 tablets (0.25-0.5 mg total) by mouth 2 (two) times daily as needed for anxiety 60 tablet 1   ALPRAZolam (XANAX) 0.5 MG tablet Take 0.5-1 tablets (0.25-0.5 mg total) by mouth 2 (two) times daily as needed. 180 tablet 1   ALPRAZolam (XANAX) 0.5 MG tablet Take 0.5-1 tablets (0.25-0.5 mg total) by mouth 2 (two) times daily as needed. 180 tablet 0   ALPRAZolam (XANAX) 0.5 MG tablet Take 1/2-1 tablet (0.25-0.5 mg total) by mouth 2 (two) times daily as needed. 180 tablet 0   DULoxetine (CYMBALTA) 60 MG capsule Take 2 capsules (120 mg total) by mouth daily. 180 capsule 0   ketoconazole (NIZORAL) 2 % shampoo Apply approximately 5ml externally once a day 120 mL 2   predniSONE (DELTASONE) 20 MG tablet Take 2 tablets daily with breakfast. 10 tablet 0   valACYclovir (VALTREX) 500 MG tablet Take 1 tablet (500 mg total) by mouth daily as needed. 30 tablet 6   No facility-administered medications prior to visit.   Review of Systems  Constitutional:  Negative for chills, fever, malaise/fatigue and weight loss.  HENT:  Positive for congestion. Negative for sinus pain and sore throat.   Eyes: Negative.   Respiratory:  Positive for cough, sputum production and wheezing. Negative for hemoptysis and shortness of breath.    Cardiovascular:  Negative for chest pain, palpitations, orthopnea, claudication and leg swelling.  Gastrointestinal:  Negative for abdominal pain, heartburn, nausea and vomiting.  Genitourinary: Negative.   Musculoskeletal:  Negative for joint pain and myalgias.  Skin:  Negative for rash.  Neurological:  Negative for weakness and headaches.  Endo/Heme/Allergies: Negative.    Objective:   Vitals:   05/30/22 1117  BP: 122/80  Pulse: 87  Temp: 98.2 F (36.8 C)  TempSrc: Oral  SpO2: 97%  Weight: 167 lb (75.8 kg)  Height: 5\' 5"  (1.651 m)   Physical Exam Constitutional:      General: She is not in acute distress.    Appearance: She is not ill-appearing.  HENT:     Head: Normocephalic and atraumatic.  Cardiovascular:     Rate and Rhythm: Normal rate and regular rhythm.     Pulses: Normal pulses.     Heart sounds: Normal heart sounds. No murmur heard. Pulmonary:     Effort: Pulmonary effort is normal.     Breath sounds: Normal breath sounds. No wheezing, rhonchi or rales.  Musculoskeletal:     Right lower leg: No edema.     Left lower leg: No edema.  Skin:    General: Skin is warm and dry.  Neurological:     General: No focal deficit present.     Mental Status: She is alert.    CBC    Component Value Date/Time   WBC 4.9 03/08/2022 0809   RBC 4.58 03/08/2022 0809   HGB 14.3 03/08/2022 0809   HCT 43.1 03/08/2022 0809   PLT 299.0 03/08/2022 0809   MCV 94.2 03/08/2022 0809   MCHC 33.3 03/08/2022 0809   RDW 13.5 03/08/2022 0809   LYMPHSABS 1.8 03/09/2014 0800   MONOABS 0.4 03/09/2014 0800   EOSABS 0.1 03/09/2014 0800   BASOSABS 0.0 03/09/2014 0800   Chest imaging: CTA Chest 12/27/20 No pulmonary emboli.  Normal examination with exception of mild atherosclerotic calcification of the thoracic aorta. Maximal diameter of the ascending aorta 3.9 cm. Follow-up recommendations do not begin until a diameter of 4 cm.  CXR 05/05/14 The heart size and mediastinal contours  are normal. The lungs are clear. There is no pleural effusion or pneumothorax. No acute osseous findings are identified.  PFT:    Latest Ref Rng & Units 12/26/2020    9:47 AM  PFT Results  FVC-Pre L 3.36   FVC-Predicted Pre % 100   FVC-Post L 3.23   FVC-Predicted Post % 96   Pre FEV1/FVC % % 87   Post FEV1/FCV % % 86   FEV1-Pre L 2.91   FEV1-Predicted Pre % 113   FEV1-Post L 2.77   DLCO uncorrected ml/min/mmHg 20.22   DLCO UNC% % 97   DLCO corrected ml/min/mmHg 20.22   DLCO COR %Predicted % 97   DLVA Predicted % 115   PFTs 12/2020: within normal limits  Labs:  Path:  Echo 06/29/14: LVEF 60-65%. Mild LVH. Grade I diastolic dysfunction. RV size and function is normal.  Heart Catheterization:  Assessment & Plan:   Moderate persistent asthma without complication  Viral upper respiratory tract infection - Plan: predniSONE (DELTASONE) 10 MG tablet  Discussion: Lakeysia Knippel is a 67 year old woman, never smokwer with hiatal hernia, GERD and asthma who returns to pulmonary clinic for asthma.   She is having prolonged recovery after viral infection and allergy season. Her lungs are clear on exam today but she is continuing to have sinus congestion with post-nasal drainage leading to her on going cough symptoms.   She is to use breztri 2 puffs twice daily and as needed albuterol.   She is to continue zyrtec and montelukast daily. She is to use flonase 1 spray per nostril daily for at least 2 weeks.   I have sent in steroid taper incase her symptoms continue to linger.  Follow-up in 1 year, call sooner if needed.  Melody Comas, MD [facility] Office: [phone]   Current Outpatient Medications:    Acetylcysteine (N-ACETYL-L-CYSTEINE) 600 MG CAPS, Take by mouth., Disp: , Rfl:    albuterol (VENTOLIN HFA) 108 (90 Base) MCG/ACT inhaler, Inhale 2 puffs into the lungs every 4 (four) hours as needed for wheezing or shortness of breath, Disp: 6.7  g, Rfl: 0   ALPRAZolam (XANAX) 0.5 MG tablet, Take 1/2-1 tablet (0.25-0.5 mg total) by mouth 2 (two) times daily as needed., Disp: 180 tablet, Rfl: 1   Budeson-Glycopyrrol-Formoterol (BREZTRI AEROSPHERE) 160-9-4.8 MCG/ACT AERO, Inhale 2 puffs into the lungs in the morning and at bedtime., Disp: 10.7 g, Rfl: 6   cetirizine (ZYRTEC) 10 MG tablet, Take 10 mg by mouth daily., Disp: , Rfl:    Cholecalciferol (VITAMIN D3) 2000 UNITS TABS, Take 2,000 Units by mouth daily as needed (vitamin supplement). , Disp: , Rfl:    DULoxetine (CYMBALTA) 60 MG capsule, Take 2 capsules (120 mg total) by mouth daily., Disp: 180 capsule, Rfl: 3   fluticasone (FLONASE) 50 MCG/ACT nasal spray, Place 1 spray into both nostrils 2 (two) times daily as needed for allergies or rhinitis (for sinus congestion)., Disp: , Rfl:    gabapentin (NEURONTIN) 300 MG capsule, Take 1 capsule (300 mg total) by mouth 3 (three) times daily., Disp: 60 capsule, Rfl: 3   ibuprofen (ADVIL) 800 MG tablet, Take 1 tablet (800 mg total) by mouth every 8 (eight) hours as needed., Disp: 90 tablet, Rfl: 1  montelukast (SINGULAIR) 10 MG tablet, Take 1 tablet (10 mg total) by mouth daily., Disp: 90 tablet, Rfl: 3   nystatin-triamcinolone ointment (MYCOLOG), Apply 1 application topically 2 (two) times daily., Disp: 100 g, Rfl: 3   pravastatin (PRAVACHOL) 20 MG tablet, Take 1 tablet (20 mg total) by mouth daily., Disp: 90 tablet, Rfl: 3   prednisoLONE acetate (PRED FORTE) 1 % ophthalmic suspension, Place 1 drop into the left eye 4 (four) times daily., Disp: 5 mL, Rfl: 0   predniSONE (DELTASONE) 10 MG tablet, Take 4 tablets daily with breakfast for 3 days, THEN 3 tablets daily with breakfast for 3 days, THEN 2 tablets daily with breakfast for 3 days, THEN 1 tablet daily with breakfast for 3 days., Disp: 30 tablet, Rfl: 0   promethazine-dextromethorphan (PROMETHAZINE-DM) 6.25-15 MG/5ML syrup, Take 5 mLs by mouth 3 (three) times daily as needed for cough., Disp:  200 mL, Rfl: 0   traZODone (DESYREL) 50 MG tablet, Take 1-2 tablets (50-100 mg total) by mouth at bedtime., Disp: 180 tablet, Rfl: 3   TURMERIC PO, Take 1 tablet by mouth daily as needed (supplement). , Disp: , Rfl:    valACYclovir (VALTREX) 500 MG tablet, Take 1 tablet (500 mg total) by mouth daily., Disp: 90 tablet, Rfl: 3   omeprazole (PRILOSEC) 20 MG capsule, TAKE 1 CAPSULE BY MOUTH 2 TIMES DAILY BEFORE A MEAL., Disp: 180 capsule, Rfl: 3

## 2022-05-30 NOTE — Patient Instructions (Addendum)
Use breztri inhaler 2 puffs twice daily - rinse mouth out after each use  Use albuterol inhaler 1-2 puffs every 4-6 hours as needed  Use flonase nasal spray, 1 spray per nostril daily for at least 2 weeks then changing to as needed  Use zyrtec daily and montelukast daily  I have sent in prednisone taper to use if you feel your symptoms are not improving.  Follow up in 1 year, call sooner if needed

## 2022-05-31 ENCOUNTER — Other Ambulatory Visit (HOSPITAL_COMMUNITY): Payer: Self-pay

## 2022-05-31 DIAGNOSIS — L821 Other seborrheic keratosis: Secondary | ICD-10-CM | POA: Diagnosis not present

## 2022-05-31 DIAGNOSIS — D225 Melanocytic nevi of trunk: Secondary | ICD-10-CM | POA: Diagnosis not present

## 2022-05-31 DIAGNOSIS — L719 Rosacea, unspecified: Secondary | ICD-10-CM | POA: Diagnosis not present

## 2022-05-31 DIAGNOSIS — D2272 Melanocytic nevi of left lower limb, including hip: Secondary | ICD-10-CM | POA: Diagnosis not present

## 2022-05-31 DIAGNOSIS — L720 Epidermal cyst: Secondary | ICD-10-CM | POA: Diagnosis not present

## 2022-05-31 DIAGNOSIS — L578 Other skin changes due to chronic exposure to nonionizing radiation: Secondary | ICD-10-CM | POA: Diagnosis not present

## 2022-05-31 MED ORDER — FLUTICASONE PROPIONATE 50 MCG/ACT NA SUSP
1.0000 | Freq: Two times a day (BID) | NASAL | 3 refills | Status: DC | PRN
  Filled 2022-05-31: qty 48, 90d supply, fill #0
  Filled 2023-04-22: qty 48, 90d supply, fill #1

## 2022-06-05 DIAGNOSIS — Z6827 Body mass index (BMI) 27.0-27.9, adult: Secondary | ICD-10-CM | POA: Diagnosis not present

## 2022-06-05 DIAGNOSIS — Z01419 Encounter for gynecological examination (general) (routine) without abnormal findings: Secondary | ICD-10-CM | POA: Diagnosis not present

## 2022-06-05 DIAGNOSIS — Z1231 Encounter for screening mammogram for malignant neoplasm of breast: Secondary | ICD-10-CM | POA: Diagnosis not present

## 2022-06-06 DIAGNOSIS — J329 Chronic sinusitis, unspecified: Secondary | ICD-10-CM | POA: Diagnosis not present

## 2022-06-06 DIAGNOSIS — M9902 Segmental and somatic dysfunction of thoracic region: Secondary | ICD-10-CM | POA: Diagnosis not present

## 2022-06-06 DIAGNOSIS — M531 Cervicobrachial syndrome: Secondary | ICD-10-CM | POA: Diagnosis not present

## 2022-06-06 DIAGNOSIS — M4802 Spinal stenosis, cervical region: Secondary | ICD-10-CM | POA: Diagnosis not present

## 2022-06-06 DIAGNOSIS — M5022 Other cervical disc displacement, mid-cervical region, unspecified level: Secondary | ICD-10-CM | POA: Diagnosis not present

## 2022-06-06 DIAGNOSIS — M9901 Segmental and somatic dysfunction of cervical region: Secondary | ICD-10-CM | POA: Diagnosis not present

## 2022-06-06 DIAGNOSIS — M47812 Spondylosis without myelopathy or radiculopathy, cervical region: Secondary | ICD-10-CM | POA: Diagnosis not present

## 2022-06-11 ENCOUNTER — Other Ambulatory Visit: Payer: Self-pay

## 2022-06-11 ENCOUNTER — Other Ambulatory Visit (HOSPITAL_COMMUNITY): Payer: Self-pay

## 2022-06-19 ENCOUNTER — Ambulatory Visit
Admission: RE | Admit: 2022-06-19 | Discharge: 2022-06-19 | Disposition: A | Discharge status: Home / Self Care | Payer: Commercial Managed Care - PPO | Source: Ambulatory Visit | Attending: Internal Medicine | Admitting: Internal Medicine

## 2022-06-19 DIAGNOSIS — R911 Solitary pulmonary nodule: Secondary | ICD-10-CM | POA: Diagnosis not present

## 2022-06-19 DIAGNOSIS — I7121 Aneurysm of the ascending aorta, without rupture: Secondary | ICD-10-CM

## 2022-06-19 MED ORDER — IOPAMIDOL (ISOVUE-370) INJECTION 76%
75.0000 mL | Freq: Once | INTRAVENOUS | Status: AC | PRN
  Administered 2022-06-19: 75 mL via INTRAVENOUS

## 2022-06-20 ENCOUNTER — Other Ambulatory Visit: Payer: Self-pay | Admitting: Internal Medicine

## 2022-06-20 DIAGNOSIS — E041 Nontoxic single thyroid nodule: Secondary | ICD-10-CM

## 2022-06-21 ENCOUNTER — Telehealth: Payer: Commercial Managed Care - PPO | Admitting: Family Medicine

## 2022-06-21 ENCOUNTER — Encounter: Payer: Self-pay | Admitting: Internal Medicine

## 2022-06-21 DIAGNOSIS — U071 COVID-19: Secondary | ICD-10-CM

## 2022-06-21 MED ORDER — MOLNUPIRAVIR EUA 200MG CAPSULE
4.0000 | ORAL_CAPSULE | Freq: Two times a day (BID) | ORAL | 0 refills | Status: AC
Start: 2022-06-21 — End: 2022-06-26

## 2022-06-21 NOTE — Patient Instructions (Addendum)
Bennie Pierini, thank you for joining Freddy Finner, NP for today's virtual visit.  While this provider is not your primary care provider (PCP), if your PCP is located in our provider database this encounter information will be shared with them immediately following your visit.   A [facility] MyChart account gives you access to today's visit and all your visits, tests, and labs performed at [facility] " click here if you don't have a  MyChart account or go to [email or web address]  Consent: (Patient) Gina Graves provided verbal consent for this virtual visit at the beginning of the encounter.  Current Medications:  Current Outpatient Medications:    molnupiravir EUA (LAGEVRIO) 200 mg CAPS capsule, Take 4 capsules (800 mg total) by mouth 2 (two) times daily for 5 days., Disp: 40 capsule, Rfl: 0   Acetylcysteine (N-ACETYL-L-CYSTEINE) 600 MG CAPS, Take by mouth., Disp: , Rfl:    albuterol (VENTOLIN HFA) 108 (90 Base) MCG/ACT inhaler, Inhale 2 puffs into the lungs every 4 (four) hours as needed for wheezing or shortness of breath, Disp: 6.7 g, Rfl: 0   ALPRAZolam (XANAX) 0.5 MG tablet, Take 1/2-1 tablet (0.25-0.5 mg total) by mouth 2 (two) times daily as needed., Disp: 180 tablet, Rfl: 1   Budeson-Glycopyrrol-Formoterol (BREZTRI AEROSPHERE) 160-9-4.8 MCG/ACT AERO, Inhale 2 puffs into the lungs in the morning and at bedtime., Disp: 10.7 g, Rfl: 6   cetirizine (ZYRTEC) 10 MG tablet, Take 10 mg by mouth daily., Disp: , Rfl:    Cholecalciferol (VITAMIN D3) 2000 UNITS TABS, Take 2,000 Units by mouth daily as needed (vitamin supplement). , Disp: , Rfl:    DULoxetine (CYMBALTA) 60 MG capsule, Take 2 capsules (120 mg total) by mouth daily., Disp: 180 capsule, Rfl: 3   fluticasone (FLONASE) 50 MCG/ACT nasal spray, Place 1 spray into both nostrils 2 (two) times daily as needed for allergies or rhinitis (for sinus congestion)., Disp: 48 g, Rfl: 3   gabapentin  (NEURONTIN) 300 MG capsule, Take 1 capsule (300 mg total) by mouth 3 (three) times daily., Disp: 60 capsule, Rfl: 3   ibuprofen (ADVIL) 800 MG tablet, Take 1 tablet (800 mg total) by mouth every 8 (eight) hours as needed., Disp: 90 tablet, Rfl: 1   montelukast (SINGULAIR) 10 MG tablet, Take 1 tablet (10 mg total) by mouth daily., Disp: 90 tablet, Rfl: 3   nystatin-triamcinolone ointment (MYCOLOG), Apply 1 application topically 2 (two) times daily., Disp: 100 g, Rfl: 3   omeprazole (PRILOSEC) 20 MG capsule, TAKE 1 CAPSULE BY MOUTH 2 TIMES DAILY BEFORE A MEAL., Disp: 180 capsule, Rfl: 3   pravastatin (PRAVACHOL) 20 MG tablet, Take 1 tablet (20 mg total) by mouth daily., Disp: 90 tablet, Rfl: 3   prednisoLONE acetate (PRED FORTE) 1 % ophthalmic suspension, Place 1 drop into the left eye 4 (four) times daily., Disp: 5 mL, Rfl: 0   promethazine-dextromethorphan (PROMETHAZINE-DM) 6.25-15 MG/5ML syrup, Take 5 mLs by mouth 3 (three) times daily as needed for cough., Disp: 200 mL, Rfl: 0   traZODone (DESYREL) 50 MG tablet, Take 1-2 tablets (50-100 mg total) by mouth at bedtime., Disp: 180 tablet, Rfl: 3   TURMERIC PO, Take 1 tablet by mouth daily as needed (supplement). , Disp: , Rfl:    valACYclovir (VALTREX) 500 MG tablet, Take 1 tablet (500 mg total) by mouth daily., Disp: 90 tablet, Rfl: 3   Medications ordered in this encounter:  Meds ordered this encounter  Medications   molnupiravir EUA (LAGEVRIO) 200  mg CAPS capsule    Sig: Take 4 capsules (800 mg total) by mouth 2 (two) times daily for 5 days.    Dispense:  40 capsule    Refill:  0    Order Specific Question:   Supervising Provider    Answer:   Merrilee Jansky [id number]     *If you need refills on other medications prior to your next appointment, please contact your pharmacy*  Follow-Up: Call back or seek an in-person evaluation if the symptoms worsen or if the condition fails to improve as anticipated.  [facility] [phone]  Other Instructions  - Continue OTC symptomatic management of choice  - Take prescribed medications as directed - Push fluids - Rest as needed   If you have been instructed to have an in-person evaluation today at a local Urgent Care facility, please use the link below. It will take you to a list of all of our available [facility], including address, phone number and hours of operation. Please do not delay care.  [facility]  If you or a family member do not have a primary care provider, use the link below to schedule a visit and establish care. When you choose a [facility] primary care physician or advanced practice provider, you gain a long-term partner in health. Find a Primary Care Provider  Learn more about [facility]'s in-office and virtual care options: [facility] Get Care Now

## 2022-06-21 NOTE — Progress Notes (Signed)
Virtual Visit Consent   Gina Graves, you are scheduled for a virtual visit with a [facility] provider today. Just as with appointments in the office, your consent must be obtained to participate. Your consent will be active for this visit and any virtual visit you may have with one of our providers in the next 365 days. If you have a MyChart account, a copy of this consent can be sent to you electronically.  As this is a virtual visit, video technology does not allow for your provider to perform a traditional examination. This may limit your provider's ability to fully assess your condition. If your provider identifies any concerns that need to be evaluated in person or the need to arrange testing (such as labs, EKG, etc.), we will make arrangements to do so. Although advances in technology are sophisticated, we cannot ensure that it will always work on either your end or our end. If the connection with a video visit is poor, the visit may have to be switched to a telephone visit. With either a video or telephone visit, we are not always able to ensure that we have a secure connection.  By engaging in this virtual visit, you consent to the provision of healthcare and authorize for your insurance to be billed (if applicable) for the services provided during this visit. Depending on your insurance coverage, you may receive a charge related to this service.  I need to obtain your verbal consent now. Are you willing to proceed with your visit today? Gina Graves has provided verbal consent on 06/21/2022 for a virtual visit (video or telephone). Freddy Finner, NP  Date: 06/21/2022 11:27 AM  Virtual Visit via Video Note   I, Freddy Finner, connected with  Gina Graves  ([id number], 04-01-1955) on 06/21/22 at 11:30 AM EDT by a video-enabled telemedicine application and verified that I am speaking with the correct person using two identifiers.  Location: Patient: Virtual Visit Location  Patient: Home Provider: Virtual Visit Location Provider: Home Office   I discussed the limitations of evaluation and management by telemedicine and the availability of in person appointments. The patient expressed understanding and agreed to proceed.    History of Present Illness: Gina Graves is a 67 y.o. who identifies as a female who was assigned female at birth, and is being seen today for COVID + as of this morning  Onset was Thursday with headache and sore throat Associated symptoms are hoarseness, congestion - overall just feeling bad Modifying factors are inhalers, day quil, and flonase Denies chest pain, shortness of breath, fevers, chills  Exposure to sick contacts- husband has covid COVID test: + Vaccines: no boosters recently  Has history of asthma that is controlled    Problems:  Patient Active Problem List   Diagnosis Date Noted   Aneurysm of ascending aorta without rupture (HCC) 03/05/2022   Chronic maxillary sinusitis 10/20/2017   Dysfunction of left eustachian tube 10/20/2017   Joint swelling 02/06/2017   Routine general medical examination at a health care facility 06/09/2015   Heart murmur 06/22/2014   Hyperlipidemia 03/13/2014   Family history of early CAD 03/04/2014   Osteopenia 05/10/2009   Asthma, mild intermittent 04/25/2008   Depression 01/28/2007   Allergic rhinitis 01/28/2007    Allergies:  Allergies  Allergen Reactions   Bupropion Hcl     REACTION: headaches   Medications:  Current Outpatient Medications:    Acetylcysteine (N-ACETYL-L-CYSTEINE) 600 MG CAPS, Take by mouth., Disp: ,  Rfl:    albuterol (VENTOLIN HFA) 108 (90 Base) MCG/ACT inhaler, Inhale 2 puffs into the lungs every 4 (four) hours as needed for wheezing or shortness of breath, Disp: 6.7 g, Rfl: 0   ALPRAZolam (XANAX) 0.5 MG tablet, Take 1/2-1 tablet (0.25-0.5 mg total) by mouth 2 (two) times daily as needed., Disp: 180 tablet, Rfl: 1   Budeson-Glycopyrrol-Formoterol  (BREZTRI AEROSPHERE) 160-9-4.8 MCG/ACT AERO, Inhale 2 puffs into the lungs in the morning and at bedtime., Disp: 10.7 g, Rfl: 6   cetirizine (ZYRTEC) 10 MG tablet, Take 10 mg by mouth daily., Disp: , Rfl:    Cholecalciferol (VITAMIN D3) 2000 UNITS TABS, Take 2,000 Units by mouth daily as needed (vitamin supplement). , Disp: , Rfl:    DULoxetine (CYMBALTA) 60 MG capsule, Take 2 capsules (120 mg total) by mouth daily., Disp: 180 capsule, Rfl: 3   fluticasone (FLONASE) 50 MCG/ACT nasal spray, Place 1 spray into both nostrils 2 (two) times daily as needed for allergies or rhinitis (for sinus congestion)., Disp: 48 g, Rfl: 3   gabapentin (NEURONTIN) 300 MG capsule, Take 1 capsule (300 mg total) by mouth 3 (three) times daily., Disp: 60 capsule, Rfl: 3   ibuprofen (ADVIL) 800 MG tablet, Take 1 tablet (800 mg total) by mouth every 8 (eight) hours as needed., Disp: 90 tablet, Rfl: 1   montelukast (SINGULAIR) 10 MG tablet, Take 1 tablet (10 mg total) by mouth daily., Disp: 90 tablet, Rfl: 3   nystatin-triamcinolone ointment (MYCOLOG), Apply 1 application topically 2 (two) times daily., Disp: 100 g, Rfl: 3   omeprazole (PRILOSEC) 20 MG capsule, TAKE 1 CAPSULE BY MOUTH 2 TIMES DAILY BEFORE A MEAL., Disp: 180 capsule, Rfl: 3   pravastatin (PRAVACHOL) 20 MG tablet, Take 1 tablet (20 mg total) by mouth daily., Disp: 90 tablet, Rfl: 3   prednisoLONE acetate (PRED FORTE) 1 % ophthalmic suspension, Place 1 drop into the left eye 4 (four) times daily., Disp: 5 mL, Rfl: 0   promethazine-dextromethorphan (PROMETHAZINE-DM) 6.25-15 MG/5ML syrup, Take 5 mLs by mouth 3 (three) times daily as needed for cough., Disp: 200 mL, Rfl: 0   traZODone (DESYREL) 50 MG tablet, Take 1-2 tablets (50-100 mg total) by mouth at bedtime., Disp: 180 tablet, Rfl: 3   TURMERIC PO, Take 1 tablet by mouth daily as needed (supplement). , Disp: , Rfl:    valACYclovir (VALTREX) 500 MG tablet, Take 1 tablet (500 mg total) by mouth daily., Disp: 90  tablet, Rfl: 3  Observations/Objective: Patient is well-developed, well-nourished in no acute distress.  Resting comfortably  at home.  Head is normocephalic, atraumatic.  No labored breathing.  Speech is clear and coherent with logical content.  Patient is alert and oriented at baseline.  Cough present  Assessment and Plan:  1. COVID-19  - molnupiravir EUA (LAGEVRIO) 200 mg CAPS capsule; Take 4 capsules (800 mg total) by mouth 2 (two) times daily for 5 days.  Dispense: 40 capsule; Refill: 0  - Continue OTC symptomatic management of choice - Info for COVID sent on AVS as well - Take prescribed medications as directed - Push fluids - Rest as needed - Discussed return precautions and when to seek in-person evaluation, sent via AVS as well  Reviewed side effects, risks and benefits of medication.    Patient acknowledged agreement and understanding of the plan.   Past Medical, Surgical, Social History, Allergies, and Medications have been Reviewed.     Follow Up Instructions: I discussed the assessment and treatment plan  with the patient. The patient was provided an opportunity to ask questions and all were answered. The patient agreed with the plan and demonstrated an understanding of the instructions.  A copy of instructions were sent to the patient via MyChart unless otherwise noted below.    The patient was advised to call back or seek an in-person evaluation if the symptoms worsen or if the condition fails to improve as anticipated.  Time:  I spent 10 minutes with the patient via telehealth technology discussing the above problems/concerns.    Freddy Finner, NP

## 2022-06-30 ENCOUNTER — Other Ambulatory Visit: Payer: Self-pay | Admitting: Internal Medicine

## 2022-07-01 ENCOUNTER — Other Ambulatory Visit (HOSPITAL_COMMUNITY): Payer: Self-pay

## 2022-07-01 MED ORDER — PRAVASTATIN SODIUM 20 MG PO TABS
20.0000 mg | ORAL_TABLET | Freq: Every day | ORAL | 3 refills | Status: DC
  Filled 2022-07-01: qty 90, 90d supply, fill #0
  Filled 2022-09-27: qty 90, 90d supply, fill #1
  Filled 2022-12-23: qty 90, 90d supply, fill #2
  Filled 2023-03-24: qty 30, 30d supply, fill #3
  Filled 2023-04-22 – 2023-04-27 (×2): qty 90, 90d supply, fill #3

## 2022-07-11 ENCOUNTER — Other Ambulatory Visit (HOSPITAL_COMMUNITY): Payer: Self-pay

## 2022-07-12 ENCOUNTER — Other Ambulatory Visit: Payer: Self-pay

## 2022-07-15 DIAGNOSIS — M9901 Segmental and somatic dysfunction of cervical region: Secondary | ICD-10-CM | POA: Diagnosis not present

## 2022-07-15 DIAGNOSIS — M47812 Spondylosis without myelopathy or radiculopathy, cervical region: Secondary | ICD-10-CM | POA: Diagnosis not present

## 2022-07-15 DIAGNOSIS — M5022 Other cervical disc displacement, mid-cervical region, unspecified level: Secondary | ICD-10-CM | POA: Diagnosis not present

## 2022-07-15 DIAGNOSIS — J329 Chronic sinusitis, unspecified: Secondary | ICD-10-CM | POA: Diagnosis not present

## 2022-07-15 DIAGNOSIS — M4802 Spinal stenosis, cervical region: Secondary | ICD-10-CM | POA: Diagnosis not present

## 2022-07-15 DIAGNOSIS — M531 Cervicobrachial syndrome: Secondary | ICD-10-CM | POA: Diagnosis not present

## 2022-07-15 DIAGNOSIS — M9902 Segmental and somatic dysfunction of thoracic region: Secondary | ICD-10-CM | POA: Diagnosis not present

## 2022-07-16 ENCOUNTER — Ambulatory Visit
Admission: RE | Admit: 2022-07-16 | Discharge: 2022-07-16 | Disposition: A | Discharge status: Home / Self Care | Payer: Commercial Managed Care - PPO | Source: Ambulatory Visit | Attending: Internal Medicine | Admitting: Internal Medicine

## 2022-07-16 DIAGNOSIS — E042 Nontoxic multinodular goiter: Secondary | ICD-10-CM | POA: Diagnosis not present

## 2022-07-16 DIAGNOSIS — E041 Nontoxic single thyroid nodule: Secondary | ICD-10-CM

## 2022-07-31 ENCOUNTER — Other Ambulatory Visit: Payer: Self-pay | Admitting: Oncology

## 2022-07-31 DIAGNOSIS — Z006 Encounter for examination for normal comparison and control in clinical research program: Secondary | ICD-10-CM

## 2022-08-12 DIAGNOSIS — M531 Cervicobrachial syndrome: Secondary | ICD-10-CM | POA: Diagnosis not present

## 2022-08-12 DIAGNOSIS — M9901 Segmental and somatic dysfunction of cervical region: Secondary | ICD-10-CM | POA: Diagnosis not present

## 2022-08-12 DIAGNOSIS — M5022 Other cervical disc displacement, mid-cervical region, unspecified level: Secondary | ICD-10-CM | POA: Diagnosis not present

## 2022-08-12 DIAGNOSIS — M47812 Spondylosis without myelopathy or radiculopathy, cervical region: Secondary | ICD-10-CM | POA: Diagnosis not present

## 2022-08-12 DIAGNOSIS — M4802 Spinal stenosis, cervical region: Secondary | ICD-10-CM | POA: Diagnosis not present

## 2022-08-12 DIAGNOSIS — J329 Chronic sinusitis, unspecified: Secondary | ICD-10-CM | POA: Diagnosis not present

## 2022-08-12 DIAGNOSIS — M9902 Segmental and somatic dysfunction of thoracic region: Secondary | ICD-10-CM | POA: Diagnosis not present

## 2022-08-22 ENCOUNTER — Encounter (INDEPENDENT_AMBULATORY_CARE_PROVIDER_SITE_OTHER): Payer: Self-pay

## 2022-08-23 ENCOUNTER — Ambulatory Visit: Payer: Commercial Managed Care - PPO | Admitting: Internal Medicine

## 2022-09-09 DIAGNOSIS — M531 Cervicobrachial syndrome: Secondary | ICD-10-CM | POA: Diagnosis not present

## 2022-09-09 DIAGNOSIS — J329 Chronic sinusitis, unspecified: Secondary | ICD-10-CM | POA: Diagnosis not present

## 2022-09-09 DIAGNOSIS — M9901 Segmental and somatic dysfunction of cervical region: Secondary | ICD-10-CM | POA: Diagnosis not present

## 2022-09-09 DIAGNOSIS — M47812 Spondylosis without myelopathy or radiculopathy, cervical region: Secondary | ICD-10-CM | POA: Diagnosis not present

## 2022-09-09 DIAGNOSIS — M4802 Spinal stenosis, cervical region: Secondary | ICD-10-CM | POA: Diagnosis not present

## 2022-09-09 DIAGNOSIS — M5022 Other cervical disc displacement, mid-cervical region, unspecified level: Secondary | ICD-10-CM | POA: Diagnosis not present

## 2022-09-09 DIAGNOSIS — M9902 Segmental and somatic dysfunction of thoracic region: Secondary | ICD-10-CM | POA: Diagnosis not present

## 2022-09-18 DIAGNOSIS — H43813 Vitreous degeneration, bilateral: Secondary | ICD-10-CM | POA: Diagnosis not present

## 2022-10-08 DIAGNOSIS — J329 Chronic sinusitis, unspecified: Secondary | ICD-10-CM | POA: Diagnosis not present

## 2022-10-08 DIAGNOSIS — M47812 Spondylosis without myelopathy or radiculopathy, cervical region: Secondary | ICD-10-CM | POA: Diagnosis not present

## 2022-10-08 DIAGNOSIS — M4802 Spinal stenosis, cervical region: Secondary | ICD-10-CM | POA: Diagnosis not present

## 2022-10-08 DIAGNOSIS — M9902 Segmental and somatic dysfunction of thoracic region: Secondary | ICD-10-CM | POA: Diagnosis not present

## 2022-10-08 DIAGNOSIS — M531 Cervicobrachial syndrome: Secondary | ICD-10-CM | POA: Diagnosis not present

## 2022-10-08 DIAGNOSIS — M5022 Other cervical disc displacement, mid-cervical region, unspecified level: Secondary | ICD-10-CM | POA: Diagnosis not present

## 2022-10-08 DIAGNOSIS — M9901 Segmental and somatic dysfunction of cervical region: Secondary | ICD-10-CM | POA: Diagnosis not present

## 2022-11-01 ENCOUNTER — Other Ambulatory Visit (HOSPITAL_COMMUNITY): Payer: Self-pay

## 2022-11-04 ENCOUNTER — Other Ambulatory Visit (HOSPITAL_COMMUNITY)
Admission: RE | Admit: 2022-11-04 | Discharge: 2022-11-04 | Disposition: A | Discharge status: Home / Self Care | Payer: Commercial Managed Care - PPO | Source: Ambulatory Visit | Attending: Oncology | Admitting: Oncology

## 2022-11-04 DIAGNOSIS — Z006 Encounter for examination for normal comparison and control in clinical research program: Secondary | ICD-10-CM | POA: Insufficient documentation

## 2022-11-05 DIAGNOSIS — M9901 Segmental and somatic dysfunction of cervical region: Secondary | ICD-10-CM | POA: Diagnosis not present

## 2022-11-05 DIAGNOSIS — M9902 Segmental and somatic dysfunction of thoracic region: Secondary | ICD-10-CM | POA: Diagnosis not present

## 2022-11-05 DIAGNOSIS — J329 Chronic sinusitis, unspecified: Secondary | ICD-10-CM | POA: Diagnosis not present

## 2022-11-05 DIAGNOSIS — M5022 Other cervical disc displacement, mid-cervical region, unspecified level: Secondary | ICD-10-CM | POA: Diagnosis not present

## 2022-11-05 DIAGNOSIS — M47812 Spondylosis without myelopathy or radiculopathy, cervical region: Secondary | ICD-10-CM | POA: Diagnosis not present

## 2022-11-05 DIAGNOSIS — M4802 Spinal stenosis, cervical region: Secondary | ICD-10-CM | POA: Diagnosis not present

## 2022-11-05 DIAGNOSIS — M531 Cervicobrachial syndrome: Secondary | ICD-10-CM | POA: Diagnosis not present

## 2022-11-08 ENCOUNTER — Other Ambulatory Visit (HOSPITAL_BASED_OUTPATIENT_CLINIC_OR_DEPARTMENT_OTHER): Payer: Self-pay

## 2022-11-08 MED ORDER — COVID-19 MRNA VAC-TRIS(PFIZER) 30 MCG/0.3ML IM SUSY
0.3000 mL | PREFILLED_SYRINGE | Freq: Once | INTRAMUSCULAR | 0 refills | Status: AC
  Filled 2022-11-08: qty 0.3, 1d supply, fill #0

## 2022-11-15 LAB — HELIX MOLECULAR SCREEN: Genetic Analysis Overall Interpretation: NEGATIVE

## 2022-11-20 ENCOUNTER — Other Ambulatory Visit (HOSPITAL_COMMUNITY): Payer: Self-pay

## 2022-12-03 ENCOUNTER — Other Ambulatory Visit (HOSPITAL_COMMUNITY): Payer: Self-pay

## 2022-12-10 DIAGNOSIS — M4802 Spinal stenosis, cervical region: Secondary | ICD-10-CM | POA: Diagnosis not present

## 2022-12-10 DIAGNOSIS — M531 Cervicobrachial syndrome: Secondary | ICD-10-CM | POA: Diagnosis not present

## 2022-12-10 DIAGNOSIS — M9902 Segmental and somatic dysfunction of thoracic region: Secondary | ICD-10-CM | POA: Diagnosis not present

## 2022-12-10 DIAGNOSIS — M47812 Spondylosis without myelopathy or radiculopathy, cervical region: Secondary | ICD-10-CM | POA: Diagnosis not present

## 2022-12-10 DIAGNOSIS — J329 Chronic sinusitis, unspecified: Secondary | ICD-10-CM | POA: Diagnosis not present

## 2022-12-10 DIAGNOSIS — M9901 Segmental and somatic dysfunction of cervical region: Secondary | ICD-10-CM | POA: Diagnosis not present

## 2022-12-10 DIAGNOSIS — M5022 Other cervical disc displacement, mid-cervical region, unspecified level: Secondary | ICD-10-CM | POA: Diagnosis not present

## 2022-12-13 ENCOUNTER — Other Ambulatory Visit: Payer: Self-pay

## 2022-12-13 ENCOUNTER — Other Ambulatory Visit (HOSPITAL_COMMUNITY): Payer: Self-pay

## 2022-12-23 ENCOUNTER — Other Ambulatory Visit (HOSPITAL_COMMUNITY): Payer: Self-pay

## 2022-12-30 ENCOUNTER — Other Ambulatory Visit (HOSPITAL_COMMUNITY): Payer: Self-pay

## 2023-01-02 ENCOUNTER — Other Ambulatory Visit (HOSPITAL_COMMUNITY): Payer: Self-pay

## 2023-01-03 ENCOUNTER — Other Ambulatory Visit (HOSPITAL_COMMUNITY): Payer: Self-pay

## 2023-01-03 ENCOUNTER — Other Ambulatory Visit: Payer: Self-pay

## 2023-01-03 ENCOUNTER — Other Ambulatory Visit: Payer: Self-pay | Admitting: Internal Medicine

## 2023-01-03 MED ORDER — VALACYCLOVIR HCL 500 MG PO TABS
500.0000 mg | ORAL_TABLET | Freq: Every day | ORAL | 3 refills | Status: AC
  Filled 2023-01-03 (×2): qty 90, 90d supply, fill #0
  Filled 2023-04-22 – 2023-12-16 (×2): qty 90, 90d supply, fill #1

## 2023-01-13 ENCOUNTER — Other Ambulatory Visit (HOSPITAL_COMMUNITY): Payer: Self-pay

## 2023-01-13 ENCOUNTER — Ambulatory Visit: Payer: Commercial Managed Care - PPO | Admitting: Pulmonary Disease

## 2023-01-13 ENCOUNTER — Encounter: Payer: Self-pay | Admitting: Pulmonary Disease

## 2023-01-13 VITALS — BP 127/85 | HR 75 | Temp 98.1°F | Ht 65.0 in | Wt 169.8 lb

## 2023-01-13 DIAGNOSIS — J4540 Moderate persistent asthma, uncomplicated: Secondary | ICD-10-CM

## 2023-01-13 DIAGNOSIS — Z23 Encounter for immunization: Secondary | ICD-10-CM

## 2023-01-13 MED ORDER — OMEPRAZOLE 20 MG PO CPDR
20.0000 mg | DELAYED_RELEASE_CAPSULE | Freq: Two times a day (BID) | ORAL | 3 refills | Status: DC
  Filled 2023-01-13: qty 180, 90d supply, fill #0
  Filled 2023-04-22: qty 180, 90d supply, fill #1

## 2023-01-13 NOTE — Progress Notes (Signed)
Synopsis: Referred in December 2022 for Asthma by Hillard Danker, MD  Subjective:   PATIENT ID: Gina Pierini GENDER: female DOB: 1955/01/28, MRN: [id number]  HPI  No chief complaint on file.  Gina Graves is a 67 year old woman, never smokwer with hiatal hernia, GERD and asthma who returns to pulmonary clinic for asthma.   The patient, with a history of asthma, presents with recent symptoms suggestive of a respiratory infection. She reports feeling as though she was coming down with a cold, experiencing night sweats and chest tightness, which woke her up one night. She initially had difficulty breathing, which she attributed to chest congestion. She has been coughing, with some production of mucus.  Despite these symptoms, the patient has been adhering to her prescribed regimen of Breztri, two puffs twice a day, although she occasionally forgets the evening dose. She has not felt the need to use her albuterol inhaler frequently, only when exposed to dust or during cleaning activities.  The patient also reports experiencing sweats, which she differentiates from menopausal sweats, and feeling hot. However, she denies any changes in appetite, weight, skin rashes, or joint pains. She is also on Flonase as needed, Singulair, and Zyrtec at bedtime for allergies.  The patient's asthma symptoms seem to be intermittently affected by either viral infections or seasonal changes, and she has not needed oral steroids for a while. She is due for a Pneumovax.  OV 05/30/22 She was seen in urgent care on 4/28 for URI and treated with prednisone, 40mg  daily for 5 days. Her husband was sick as well. She continues to have night sweats. Her head/hair will be soaked. No fevers or chills. She is wheezing at night. She has only been using breztrin in the mornings. She is using flonase as needed. She is having cough due to sinus drainage and throat irritation.   OV 10/18/21 She has been doing well on  breztri. She recently gained her energy levels back and has been able to resume physical activity with walking. She continues zyrtec and montelukast daily for allergies.  OV 02/20/21 She reports improvement in her breathing since starting breztri inhaler 2 puffs twice daily since last month. She reports a 3 day episode of fatigue, shortness of breath and chest discomfort that improved spontaneously.   CTA Chest 12/27/20 was negative for pulmonary emboli and normal lung parenchyma.   OV 12/26/20 Patient has history of asthma in which she was diagnosed 20 years ago and she has been well controlled on ICS/LABA inhalers over many years until recently having COVID in 9/22.  Since having COVID she has had increased chest tightness, right-sided chest discomfort, daily shortness of breath and intermittent wheezing.  Her ICS/LAMA inhaler has been increased to Breo Ellipta 200-25 MCG 1 puff daily at the end of October which initially helped her symptoms but then she continued to have return of the above symptoms.  She does have seasonal allergies in which she takes Singulair and Zyrtec daily.  She does have sinus congestion and postnasal drainage in which she takes fluticasone nasal spray.  She does not report increase in sinus symptoms since having COVID.  She also has history of GERD which is no different since having COVID.  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  They recently moved into a new office that was renovated but denies any significant changes in her symptoms in the work environment compared to being at home.  She does have history  of secondhand smoke exposure from her husband.  She is otherwise a never smoker.  Past Medical History:  Diagnosis Date   Asthma    recurrence @ age 80; ? childhood asthma as EIB   GERD (gastroesophageal reflux disease) 1995   hiatal hernia      Family History  Problem Relation Age of Onset   Breast cancer Mother    Asthma  Mother        asthmatic bronchitis   Heart attack Father 24       3 vessel CABG   Hashimoto's thyroiditis Sister    Autism Brother    Diabetes Paternal Grandmother    Heart failure Paternal Grandmother        in 61s   Diabetes Paternal Grandfather    Heart attack Paternal Grandfather 38   Heart attack Paternal Uncle 45   Coronary artery disease Other        paternal FH   Stroke Neg Hx    COPD Neg Hx      Social History   Socioeconomic History   Marital status: Married    Spouse name: Not on file   Number of children: Not on file   Years of education: Not on file   Highest education level: Not on file  Occupational History   Not on file  Tobacco Use   Smoking status: Never   Smokeless tobacco: Never   Tobacco comments:    second hand smoke X 25 years  Vaping Use   Vaping status: Never Used  Substance and Sexual Activity   Alcohol use: Yes    Alcohol/week: 2.0 standard drinks of alcohol    Types: 2 Shots of liquor per week    Comment: occ   Drug use: No   Sexual activity: Not on file  Other Topics Concern   Not on file  Social History Narrative   Not on file   Social Drivers of Health   Financial Resource Strain: Not on file  Food Insecurity: Not on file  Transportation Needs: Not on file  Physical Activity: Not on file  Stress: Not on file  Social Connections: Not on file  Intimate Partner Violence: Not on file     Allergies  Allergen Reactions   Bupropion Hcl     REACTION: headaches     Outpatient Medications Prior to Visit  Medication Sig Dispense Refill   Acetylcysteine (N-ACETYL-L-CYSTEINE) 600 MG CAPS Take by mouth.     albuterol (VENTOLIN HFA) 108 (90 Base) MCG/ACT inhaler Inhale 2 puffs into the lungs every 4 (four) hours as needed for wheezing or shortness of breath 6.7 Graves 0   ALPRAZolam (XANAX) 0.5 MG tablet Take 1/2-1 tablet (0.25-0.5mg ) by mouth 2 (two) times daily as needed. 180 tablet 1   Budeson-Glycopyrrol-Formoterol (BREZTRI  AEROSPHERE) 160-9-4.8 MCG/ACT AERO Inhale 2 puffs into the lungs in the morning and at bedtime. 10.7 Graves 6   cetirizine (ZYRTEC) 10 MG tablet Take 10 mg by mouth daily.     Cholecalciferol (VITAMIN D3) 2000 UNITS TABS Take 2,000 Units by mouth daily as needed (vitamin supplement).      DULoxetine (CYMBALTA) 60 MG capsule Take 2 capsules (120 mg total) by mouth daily. 180 capsule 3   fluticasone (FLONASE) 50 MCG/ACT nasal spray Place 1 spray into both nostrils 2 (two) times daily as needed for allergies or rhinitis (for sinus congestion). 48 Graves 3   gabapentin (NEURONTIN) 300 MG capsule Take 1 capsule (300 mg total) by mouth 3 (  three) times daily. 60 capsule 3   ibuprofen (ADVIL) 800 MG tablet Take 1 tablet (800 mg total) by mouth every 8 (eight) hours as needed. 90 tablet 1   montelukast (SINGULAIR) 10 MG tablet Take 1 tablet (10 mg total) by mouth daily. 90 tablet 3   nystatin-triamcinolone ointment (MYCOLOG) Apply 1 application topically 2 (two) times daily. 100 Graves 3   pravastatin (PRAVACHOL) 20 MG tablet Take 1 tablet (20 mg total) by mouth daily. 90 tablet 3   traZODone (DESYREL) 50 MG tablet Take 1-2 tablets (50-100 mg total) by mouth at bedtime. 180 tablet 3   TURMERIC PO Take 1 tablet by mouth daily as needed (supplement).      valACYclovir (VALTREX) 500 MG tablet Take 1 tablet (500 mg total) by mouth daily. 90 tablet 3   omeprazole (PRILOSEC) 20 MG capsule TAKE 1 CAPSULE BY MOUTH 2 TIMES DAILY BEFORE A MEAL. 180 capsule 3   prednisoLONE acetate (PRED FORTE) 1 % ophthalmic suspension Place 1 drop into the left eye 4 (four) times daily. 5 mL 0   promethazine-dextromethorphan (PROMETHAZINE-DM) 6.25-15 MG/5ML syrup Take 5 mLs by mouth 3 (three) times daily as needed for cough. 200 mL 0   No facility-administered medications prior to visit.   Review of Systems  Constitutional:  Negative for chills, fever, malaise/fatigue and weight loss.  HENT:  Negative for congestion, sinus pain and sore throat.    Eyes: Negative.   Respiratory:  Positive for cough and wheezing. Negative for hemoptysis, sputum production and shortness of breath.   Cardiovascular:  Negative for chest pain, palpitations, orthopnea, claudication and leg swelling.  Gastrointestinal:  Negative for abdominal pain, heartburn, nausea and vomiting.  Genitourinary: Negative.   Musculoskeletal:  Negative for joint pain and myalgias.  Skin:  Negative for rash.  Neurological:  Negative for weakness and headaches.  Endo/Heme/Allergies: Negative.    Objective:   Vitals:   01/13/23 1254 01/13/23 1256  BP: 127/85 127/85  Pulse:  75  Temp:  98.1 F (36.7 C)  TempSrc:  Oral  SpO2: 97% 97%  Weight:  169 lb 12.8 oz (77 kg)  Height:  5\' 5"  (1.651 m)    Physical Exam Constitutional:      General: She is not in acute distress.    Appearance: She is not ill-appearing.  HENT:     Head: Normocephalic and atraumatic.  Cardiovascular:     Rate and Rhythm: Normal rate and regular rhythm.     Pulses: Normal pulses.     Heart sounds: Normal heart sounds. No murmur heard. Pulmonary:     Effort: Pulmonary effort is normal.     Breath sounds: Normal breath sounds. No wheezing, rhonchi or rales.  Musculoskeletal:     Right lower leg: No edema.     Left lower leg: No edema.  Skin:    General: Skin is warm and dry.  Neurological:     General: No focal deficit present.     Mental Status: She is alert.    CBC    Component Value Date/Time   WBC 4.9 03/08/2022 0809   RBC 4.58 03/08/2022 0809   HGB 14.3 03/08/2022 0809   HCT 43.1 03/08/2022 0809   PLT 299.0 03/08/2022 0809   MCV 94.2 03/08/2022 0809   MCHC 33.3 03/08/2022 0809   RDW 13.5 03/08/2022 0809   LYMPHSABS 1.8 03/09/2014 0800   MONOABS 0.4 03/09/2014 0800   EOSABS 0.1 03/09/2014 0800   BASOSABS 0.0 03/09/2014 0800  Chest imaging: CTA Chest 12/27/20 No pulmonary emboli. Normal examination with exception of mild atherosclerotic calcification of the thoracic  aorta. Maximal diameter of the ascending aorta 3.9 cm. Follow-up recommendations do not begin until a diameter of 4 cm.  CXR 05/05/14 The heart size and mediastinal contours are normal. The lungs are clear. There is no pleural effusion or pneumothorax. No acute osseous findings are identified.  PFT:    Latest Ref Rng & Units 12/26/2020    9:47 AM  PFT Results  FVC-Pre L 3.36   FVC-Predicted Pre % 100   FVC-Post L 3.23   FVC-Predicted Post % 96   Pre FEV1/FVC % % 87   Post FEV1/FCV % % 86   FEV1-Pre L 2.91   FEV1-Predicted Pre % 113   FEV1-Post L 2.77   DLCO uncorrected ml/min/mmHg 20.22   DLCO UNC% % 97   DLCO corrected ml/min/mmHg 20.22   DLCO COR %Predicted % 97   DLVA Predicted % 115   PFTs 12/2020: within normal limits  Labs:  Path:  Echo 06/29/14: LVEF 60-65%. Mild LVH. Grade I diastolic dysfunction. RV size and function is normal.  Heart Catheterization:  Assessment & Plan:   Moderate persistent asthma without complication  Need for vaccination - Plan: Pneumococcal conjugate vaccine 20-valent  Discussion: Gina Graves is a 67 year old woman, never smokwer with hiatal hernia, GERD and asthma who returns to pulmonary clinic for asthma.   Asthma Recent symptoms of chest tightness and coughing, possibly due to a cold. No wheezing on exam. Currently on Breztri 2 puffs twice daily, but occasionally forgets the evening dose. Also has albuterol as needed, which is rarely used. -Continue Breztri 2 puffs twice daily, and consider moving the inhaler to the bathroom to help remember the evening dose. -Consider wearing a mask when around dust or other irritants.  General Health Maintenance Due for Pneumovax. -Administer Pneumovax today.  Follow-up in 1 year, or sooner if needed.  Melody Comas, MD [facility] Office: [phone]   Current Outpatient Medications:    Acetylcysteine (N-ACETYL-L-CYSTEINE) 600 MG CAPS, Take by mouth.,  Disp: , Rfl:    albuterol (VENTOLIN HFA) 108 (90 Base) MCG/ACT inhaler, Inhale 2 puffs into the lungs every 4 (four) hours as needed for wheezing or shortness of breath, Disp: 6.7 Graves, Rfl: 0   ALPRAZolam (XANAX) 0.5 MG tablet, Take 1/2-1 tablet (0.25-0.5mg ) by mouth 2 (two) times daily as needed., Disp: 180 tablet, Rfl: 1   Budeson-Glycopyrrol-Formoterol (BREZTRI AEROSPHERE) 160-9-4.8 MCG/ACT AERO, Inhale 2 puffs into the lungs in the morning and at bedtime., Disp: 10.7 Graves, Rfl: 6   cetirizine (ZYRTEC) 10 MG tablet, Take 10 mg by mouth daily., Disp: , Rfl:    Cholecalciferol (VITAMIN D3) 2000 UNITS TABS, Take 2,000 Units by mouth daily as needed (vitamin supplement). , Disp: , Rfl:    DULoxetine (CYMBALTA) 60 MG capsule, Take 2 capsules (120 mg total) by mouth daily., Disp: 180 capsule, Rfl: 3   fluticasone (FLONASE) 50 MCG/ACT nasal spray, Place 1 spray into both nostrils 2 (two) times daily as needed for allergies or rhinitis (for sinus congestion)., Disp: 48 Graves, Rfl: 3   gabapentin (NEURONTIN) 300 MG capsule, Take 1 capsule (300 mg total) by mouth 3 (three) times daily., Disp: 60 capsule, Rfl: 3   ibuprofen (ADVIL) 800 MG tablet, Take 1 tablet (800 mg total) by mouth every 8 (eight) hours as needed., Disp: 90 tablet, Rfl: 1   montelukast (SINGULAIR) 10 MG tablet,  Take 1 tablet (10 mg total) by mouth daily., Disp: 90 tablet, Rfl: 3   nystatin-triamcinolone ointment (MYCOLOG), Apply 1 application topically 2 (two) times daily., Disp: 100 Graves, Rfl: 3   pravastatin (PRAVACHOL) 20 MG tablet, Take 1 tablet (20 mg total) by mouth daily., Disp: 90 tablet, Rfl: 3   traZODone (DESYREL) 50 MG tablet, Take 1-2 tablets (50-100 mg total) by mouth at bedtime., Disp: 180 tablet, Rfl: 3   TURMERIC PO, Take 1 tablet by mouth daily as needed (supplement). , Disp: , Rfl:    valACYclovir (VALTREX) 500 MG tablet, Take 1 tablet (500 mg total) by mouth daily., Disp: 90 tablet, Rfl: 3   omeprazole (PRILOSEC) 20 MG capsule,  Take 1 capsule (20 mg total) by mouth 2 (two) times daily before a meal., Disp: 180 capsule, Rfl: 3

## 2023-01-13 NOTE — Patient Instructions (Signed)
We will give you pneumococcal vaccine today  Continue Breztri inhaler 2 puffs twice daily - rinse mouth out after each use  Use albuterol inhaler 1-2 puffs as needed  Follow up in 1 year, call sooner if needed

## 2023-01-17 ENCOUNTER — Other Ambulatory Visit (HOSPITAL_COMMUNITY): Payer: Self-pay

## 2023-01-20 DIAGNOSIS — M9902 Segmental and somatic dysfunction of thoracic region: Secondary | ICD-10-CM | POA: Diagnosis not present

## 2023-01-20 DIAGNOSIS — M9901 Segmental and somatic dysfunction of cervical region: Secondary | ICD-10-CM | POA: Diagnosis not present

## 2023-01-20 DIAGNOSIS — M531 Cervicobrachial syndrome: Secondary | ICD-10-CM | POA: Diagnosis not present

## 2023-01-20 DIAGNOSIS — M5022 Other cervical disc displacement, mid-cervical region, unspecified level: Secondary | ICD-10-CM | POA: Diagnosis not present

## 2023-01-20 DIAGNOSIS — J329 Chronic sinusitis, unspecified: Secondary | ICD-10-CM | POA: Diagnosis not present

## 2023-01-20 DIAGNOSIS — M47812 Spondylosis without myelopathy or radiculopathy, cervical region: Secondary | ICD-10-CM | POA: Diagnosis not present

## 2023-01-20 DIAGNOSIS — M4802 Spinal stenosis, cervical region: Secondary | ICD-10-CM | POA: Diagnosis not present

## 2023-01-29 ENCOUNTER — Other Ambulatory Visit (HOSPITAL_COMMUNITY): Payer: Self-pay

## 2023-02-03 ENCOUNTER — Other Ambulatory Visit: Payer: Self-pay

## 2023-02-05 ENCOUNTER — Other Ambulatory Visit (HOSPITAL_COMMUNITY): Payer: Self-pay

## 2023-02-05 MED ORDER — ALPRAZOLAM 0.5 MG PO TABS
0.2500 mg | ORAL_TABLET | Freq: Every day | ORAL | 4 refills | Status: DC | PRN
  Filled 2023-02-05: qty 30, 30d supply, fill #0
  Filled 2023-03-24: qty 30, 30d supply, fill #1

## 2023-02-10 ENCOUNTER — Other Ambulatory Visit (HOSPITAL_COMMUNITY): Payer: Self-pay

## 2023-03-04 ENCOUNTER — Other Ambulatory Visit (HOSPITAL_COMMUNITY): Payer: Self-pay

## 2023-03-04 ENCOUNTER — Other Ambulatory Visit: Payer: Self-pay

## 2023-03-04 ENCOUNTER — Encounter: Payer: Self-pay | Admitting: Pharmacist

## 2023-03-10 ENCOUNTER — Other Ambulatory Visit: Payer: Self-pay | Admitting: Internal Medicine

## 2023-03-16 ENCOUNTER — Other Ambulatory Visit: Payer: Self-pay | Admitting: Internal Medicine

## 2023-03-19 ENCOUNTER — Other Ambulatory Visit (HOSPITAL_COMMUNITY): Payer: Self-pay

## 2023-03-19 ENCOUNTER — Other Ambulatory Visit: Payer: Self-pay | Admitting: Internal Medicine

## 2023-03-19 MED ORDER — MONTELUKAST SODIUM 10 MG PO TABS
10.0000 mg | ORAL_TABLET | Freq: Every day | ORAL | 0 refills | Status: DC
  Filled 2023-03-19: qty 30, 30d supply, fill #0

## 2023-03-24 ENCOUNTER — Other Ambulatory Visit (HOSPITAL_COMMUNITY): Payer: Self-pay

## 2023-03-27 ENCOUNTER — Other Ambulatory Visit: Payer: Self-pay

## 2023-04-04 ENCOUNTER — Encounter: Payer: PRIVATE HEALTH INSURANCE | Admitting: Internal Medicine

## 2023-04-08 ENCOUNTER — Other Ambulatory Visit: Payer: Self-pay

## 2023-04-08 ENCOUNTER — Other Ambulatory Visit (HOSPITAL_COMMUNITY): Payer: Self-pay

## 2023-04-11 ENCOUNTER — Other Ambulatory Visit: Payer: Self-pay

## 2023-04-22 ENCOUNTER — Other Ambulatory Visit: Payer: Self-pay

## 2023-04-22 ENCOUNTER — Other Ambulatory Visit: Payer: Self-pay | Admitting: Internal Medicine

## 2023-04-23 ENCOUNTER — Other Ambulatory Visit: Payer: Self-pay

## 2023-04-23 ENCOUNTER — Other Ambulatory Visit (HOSPITAL_COMMUNITY): Payer: Self-pay

## 2023-04-23 MED ORDER — MONTELUKAST SODIUM 10 MG PO TABS
10.0000 mg | ORAL_TABLET | Freq: Every day | ORAL | 0 refills | Status: DC
  Filled 2023-04-23 – 2023-04-27 (×3): qty 30, 30d supply, fill #0

## 2023-04-24 ENCOUNTER — Other Ambulatory Visit: Payer: Self-pay

## 2023-04-24 ENCOUNTER — Other Ambulatory Visit (HOSPITAL_COMMUNITY): Payer: Self-pay

## 2023-04-27 ENCOUNTER — Other Ambulatory Visit (HOSPITAL_COMMUNITY): Payer: Self-pay

## 2023-04-27 ENCOUNTER — Encounter (HOSPITAL_COMMUNITY): Payer: Self-pay

## 2023-04-28 ENCOUNTER — Encounter: Payer: Self-pay | Admitting: Internal Medicine

## 2023-04-28 ENCOUNTER — Other Ambulatory Visit (HOSPITAL_COMMUNITY): Payer: Self-pay

## 2023-04-28 ENCOUNTER — Ambulatory Visit (INDEPENDENT_AMBULATORY_CARE_PROVIDER_SITE_OTHER): Payer: PRIVATE HEALTH INSURANCE | Admitting: Internal Medicine

## 2023-04-28 ENCOUNTER — Other Ambulatory Visit: Payer: Self-pay

## 2023-04-28 VITALS — BP 114/80 | HR 74 | Temp 98.3°F | Ht 65.0 in | Wt 159.0 lb

## 2023-04-28 DIAGNOSIS — R011 Cardiac murmur, unspecified: Secondary | ICD-10-CM

## 2023-04-28 DIAGNOSIS — R739 Hyperglycemia, unspecified: Secondary | ICD-10-CM

## 2023-04-28 DIAGNOSIS — H9193 Unspecified hearing loss, bilateral: Secondary | ICD-10-CM | POA: Diagnosis not present

## 2023-04-28 DIAGNOSIS — Z0000 Encounter for general adult medical examination without abnormal findings: Secondary | ICD-10-CM

## 2023-04-28 DIAGNOSIS — J4530 Mild persistent asthma, uncomplicated: Secondary | ICD-10-CM

## 2023-04-28 DIAGNOSIS — E782 Mixed hyperlipidemia: Secondary | ICD-10-CM | POA: Diagnosis not present

## 2023-04-28 DIAGNOSIS — Z8249 Family history of ischemic heart disease and other diseases of the circulatory system: Secondary | ICD-10-CM

## 2023-04-28 DIAGNOSIS — F3342 Major depressive disorder, recurrent, in full remission: Secondary | ICD-10-CM

## 2023-04-28 LAB — COMPREHENSIVE METABOLIC PANEL WITH GFR
ALT: 10 U/L (ref 0–35)
AST: 13 U/L (ref 0–37)
Albumin: 4.8 g/dL (ref 3.5–5.2)
Alkaline Phosphatase: 48 U/L (ref 39–117)
BUN: 15 mg/dL (ref 6–23)
CO2: 29 meq/L (ref 19–32)
Calcium: 9.6 mg/dL (ref 8.4–10.5)
Chloride: 104 meq/L (ref 96–112)
Creatinine, Ser: 0.81 mg/dL (ref 0.40–1.20)
GFR: 75.86 mL/min (ref >60.00)
Glucose, Bld: 88 mg/dL (ref 70–99)
Potassium: 4 meq/L (ref 3.5–5.1)
Sodium: 141 meq/L (ref 135–145)
Total Bilirubin: 0.5 mg/dL (ref 0.2–1.2)
Total Protein: 7 g/dL (ref 6.0–8.3)

## 2023-04-28 LAB — LIPID PANEL
Cholesterol: 182 mg/dL (ref 0–200)
HDL: 55.3 mg/dL (ref >39.00)
LDL Cholesterol: 101 mg/dL — ABNORMAL HIGH (ref 0–99)
NonHDL: 126.95
Total CHOL/HDL Ratio: 3
Triglycerides: 128 mg/dL (ref 0.0–149.0)
VLDL: 25.6 mg/dL (ref 0.0–40.0)

## 2023-04-28 LAB — CBC
HCT: 42.6 % (ref 36.0–46.0)
Hemoglobin: 14.2 g/dL (ref 12.0–15.0)
MCHC: 33.3 g/dL (ref 30.0–36.0)
MCV: 95.4 fl (ref 78.0–100.0)
Platelets: 318 10*3/uL (ref 150.0–400.0)
RBC: 4.46 Mil/uL (ref 3.87–5.11)
RDW: 13.5 % (ref 11.5–15.5)
WBC: 5.3 10*3/uL (ref 4.0–10.5)

## 2023-04-28 LAB — TSH: TSH: 1.7 u[IU]/mL (ref 0.35–5.50)

## 2023-04-28 LAB — HEMOGLOBIN A1C: Hgb A1c MFr Bld: 5.9 % (ref 4.6–6.5)

## 2023-04-28 MED ORDER — FLUTICASONE PROPIONATE 50 MCG/ACT NA SUSP
1.0000 | Freq: Two times a day (BID) | NASAL | 3 refills | Status: AC | PRN
  Filled 2023-04-28: qty 48, 90d supply, fill #0

## 2023-04-28 MED ORDER — IBUPROFEN 800 MG PO TABS
800.0000 mg | ORAL_TABLET | Freq: Three times a day (TID) | ORAL | 1 refills | Status: AC | PRN
  Filled 2023-04-28 (×3): qty 90, 30d supply, fill #0

## 2023-04-28 MED ORDER — OMEPRAZOLE 20 MG PO CPDR
20.0000 mg | DELAYED_RELEASE_CAPSULE | Freq: Two times a day (BID) | ORAL | 3 refills | Status: AC
  Filled 2023-04-28: qty 180, 90d supply, fill #0

## 2023-04-28 MED ORDER — PRAVASTATIN SODIUM 20 MG PO TABS
20.0000 mg | ORAL_TABLET | Freq: Every day | ORAL | 3 refills | Status: AC
  Filled 2023-04-28: qty 90, 90d supply, fill #0
  Filled 2023-07-22: qty 90, 90d supply, fill #1
  Filled 2023-10-18: qty 90, 90d supply, fill #2
  Filled 2024-01-18: qty 90, 90d supply, fill #3

## 2023-04-28 MED ORDER — MONTELUKAST SODIUM 10 MG PO TABS
10.0000 mg | ORAL_TABLET | Freq: Every day | ORAL | 3 refills | Status: AC
  Filled 2023-04-28: qty 90, 90d supply, fill #0
  Filled 2023-07-23: qty 90, 90d supply, fill #1
  Filled 2023-10-20: qty 90, 90d supply, fill #2
  Filled 2024-01-18: qty 90, 90d supply, fill #3

## 2023-04-28 MED ORDER — ALBUTEROL SULFATE HFA 108 (90 BASE) MCG/ACT IN AERS
2.0000 | INHALATION_SPRAY | RESPIRATORY_TRACT | 0 refills | Status: DC | PRN
  Filled 2023-04-28: qty 6.7, 25d supply, fill #0

## 2023-04-28 MED ORDER — NYSTATIN-TRIAMCINOLONE 100000-0.1 UNIT/GM-% EX OINT
1.0000 | TOPICAL_OINTMENT | Freq: Two times a day (BID) | CUTANEOUS | 3 refills | Status: AC
  Filled 2023-04-28: qty 60, 30d supply, fill #0

## 2023-04-28 NOTE — Assessment & Plan Note (Signed)
 Flu shot up to date. Pneumonia complete. Shingrix complete. Tetanus due 2028. Cologuard due 2026. Mammogram up to date with gyn will get records, pap smear aged out and dexa up to date. Counseled about sun safety and mole surveillance. Counseled about the dangers of distracted driving. Given 10 year screening recommendations.

## 2023-04-28 NOTE — Assessment & Plan Note (Signed)
 Referral done for audiology assessment as hearing loss identified on wellness exam.

## 2023-04-28 NOTE — Assessment & Plan Note (Signed)
 Not as prominent on exam today.

## 2023-04-28 NOTE — Assessment & Plan Note (Signed)
 Checking lipid panel and EKG done today which is stable. She is diet controlled currently. Adjust as needed.

## 2023-04-28 NOTE — Assessment & Plan Note (Signed)
 Mild persistent and taking breztri and no flare today. Has albuterol to use as needed and treats allergies aggressively to help avoid flare.

## 2023-04-28 NOTE — Progress Notes (Signed)
 Subjective:   Patient ID: Gina Graves, female    DOB: [birth year], 68 y.o.   MRN: [id number]  HPI Here for initial medicare wellness and follow up, no new complaints. Please see A/P for status and treatment of chronic medical problems.   Diet: heart healthy Physical activity: sedentary Depression/mood screen: negative Hearing: intact to whispered voice, trouble with low tones both ears, right worse than left Visual acuity: grossly normal with lens, performs annual eye exam which is up to date ADLs: capable Fall risk: none Home safety: good Cognitive evaluation: intact to orientation, naming, recall and repetition EOL planning: adv directives discussed  Flowsheet Row Office Visit from 04/28/2023 in [facility] [place]  PHQ-2 Total Score 0       Flowsheet Row Office Visit from 04/28/2023 in [facility]  PHQ-9 Total Score 1         02/21/2021    8:09 AM 06/05/2021    1:10 PM 03/04/2022    2:16 PM 04/28/2023    8:40 AM  Fall Risk  Falls in the past year? 1  0 0  Was there an injury with Fall? 1  0 0  Fall Risk Category Calculator 2  0 0  Fall Risk Category (Retired) Moderate     (RETIRED) Patient Fall Risk Level Moderate fall risk Low fall risk    Fall risk Follow up    Falls evaluation completed    I have personally reviewed and have noted 1. The patient's medical and social history - reviewed today no changes 2. Their use of alcohol, tobacco or illicit drugs 3. Their current medications and supplements 4. The patient's functional ability including ADL's, fall risks, home safety risks and hearing or visual impairment. 5. Diet and physical activities 6. Evidence for depression or mood disorders 7. Care team reviewed and updated 8.  The patient is not on an opioid pain medication  Patient Care Team: Myrlene Broker, MD as PCP - General (Internal Medicine) Past Medical History:  Diagnosis Date    Allergy    Wellbutrin   Anxiety 1997   Arthritis    Osteoarthritis Hands and cervical neck   Asthma    recurrence @ age 29; ? childhood asthma as EIB   Depression 1997   GERD (gastroesophageal reflux disease) 01/21/1993   hiatal hernia    Heart murmur    Neuromuscular disorder (HCC) 2019   Fibromyalgia   Thyroid disease 2024   Thyroid nodules   Ulcer 1990s   Past Surgical History:  Procedure Laterality Date   ABDOMINAL HYSTERECTOMY  01/22/1995   uterine adenoma & dysmenorrhea   APPENDECTOMY     CESAREAN SECTION  1983   COLONOSCOPY  01/22/2008   negative, Dr Matthias Hughs   tonsillectomy     TUBAL LIGATION  1983   Family History  Problem Relation Age of Onset   Breast cancer Mother    Asthma Mother        asthmatic bronchitis   Cancer Mother    Heart attack Father 93       3 vessel CABG   Heart disease Father    Hashimoto's thyroiditis Sister    Autism Brother    Diabetes Paternal Grandmother    Heart failure Paternal Grandmother        in 55s   Diabetes Paternal Grandfather    Heart attack Paternal Grandfather 32   Heart attack Paternal Uncle 65   Coronary artery  disease Other        paternal FH   ADD / ADHD Son    Birth defects Brother    Intellectual disability Brother    Hyperlipidemia Son    Stroke Neg Hx    COPD Neg Hx    Review of Systems  Constitutional: Negative.   HENT: Negative.    Eyes: Negative.   Respiratory:  Negative for cough, chest tightness and shortness of breath.   Cardiovascular:  Negative for chest pain, palpitations and leg swelling.  Gastrointestinal:  Negative for abdominal distention, abdominal pain, constipation, diarrhea, nausea and vomiting.  Musculoskeletal: Negative.   Skin: Negative.   Neurological: Negative.   Psychiatric/Behavioral: Negative.      Objective:  Physical Exam Constitutional:      Appearance: She is well-developed.  HENT:     Head: Normocephalic and atraumatic.  Cardiovascular:     Rate and Rhythm:  Normal rate and regular rhythm.  Pulmonary:     Effort: Pulmonary effort is normal. No respiratory distress.     Breath sounds: Normal breath sounds. No wheezing or rales.  Abdominal:     General: Bowel sounds are normal. There is no distension.     Palpations: Abdomen is soft.     Tenderness: There is no abdominal tenderness. There is no rebound.  Musculoskeletal:     Cervical back: Normal range of motion.  Skin:    General: Skin is warm and dry.  Neurological:     Mental Status: She is alert and oriented to person, place, and time.     Coordination: Coordination normal.    EKG: Rate 64, axis normal, interval normal, sinus, no st or t wave changes, no significant change compared to prior 2024  Vitals:   04/28/23 0835  BP: 114/80  Pulse: 74  Temp: 98.3 F (36.8 C)  TempSrc: Oral  SpO2: 96%  Weight: 159 lb (72.1 kg)  Height: 5\' 5"  (1.651 m)   Assessment & Plan:

## 2023-04-28 NOTE — Assessment & Plan Note (Signed)
 No new anginal symptoms. EKG done and stable and checking lipid panel.

## 2023-04-28 NOTE — Patient Instructions (Signed)
 EKG looks normal. We will check the labs today.   Gina Graves , Thank you for taking time to come for your Medicare Wellness Visit. I appreciate your ongoing commitment to your health goals. Please review the following plan we discussed and let me know if I can assist you in the future.   These are the goals we discussed:  Goals   None     This is a list of the screening recommended for you and due dates:  Health Maintenance  Topic Date Due   Medicare Annual Wellness Visit  Never done   Mammogram  07/29/2019   COVID-19 Vaccine (6 - Pfizer risk 2024-25 season) 05/09/2023   Flu Shot  08/22/2023   Cologuard (Stool DNA test)  03/13/2024   DTaP/Tdap/Td vaccine (3 - Td or Tdap) 11/16/2026   Pneumonia Vaccine  Completed   DEXA scan (bone density measurement)  Completed   Hepatitis C Screening  Completed   Zoster (Shingles) Vaccine  Completed   HPV Vaccine  Aged Out

## 2023-04-28 NOTE — Assessment & Plan Note (Signed)
 Taking cymbalta 60 mg daily and alprazolam as needed. Continue and refill as needed.

## 2023-05-19 ENCOUNTER — Ambulatory Visit: Payer: PRIVATE HEALTH INSURANCE | Attending: Internal Medicine | Admitting: Audiologist

## 2023-05-19 DIAGNOSIS — H903 Sensorineural hearing loss, bilateral: Secondary | ICD-10-CM | POA: Insufficient documentation

## 2023-05-19 NOTE — Procedures (Signed)
  [facility] [address] [place], [place]  [address] [phone]  AUDIOLOGICAL  EVALUATION  NAME: Gina Graves     DOB:   [birth year]      MRN: [id number]                                                                                     DATE: 05/19/2023     REFERENT: Adelia Homestead, MD STATUS: Outpatient DIAGNOSIS: Hearing Loss Sensorineural Bilateral    History: Tiffanyamber was seen for an audiological evaluation due to difficulty hearing people clearly. She feels people sound like they are mumbling if they are not facing her. She is asking people to repeat often. She feels having COVID 3 times made it worse.  Smrithi denies pain, pressure, or tinnitus. Adana no history of hazardous noise exposure.  Medical history shows no additional risk for hearing loss.    Evaluation:  Otoscopy showed a clear view of the tympanic membranes, bilaterally Tympanometry results were consistent with normal middle ear function, bilaterally   Audiometric testing was completed using Conventional Audiometry techniques with insert earphones and supraural headphones. 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 Speech Recognition Thresholds were obtained at 35 dB HL in the right ear and at 30  dB HL in the left ear. Word Recognition Testing was completed at  40dB SL and Cinda scored 100% for the right ear and 96% for the left ear.    Results:  The test results were reviewed with Jullie Oiler. She has a sloping sensorineural moderate hearing loss bilaterally. She needs hearing aids for both ears.  Audiogram printed and provided to Veazie.    Recommendations: Hearing aids necessary for both ears. Patient given list of local hearing aid providers.  Annual audiometric testing recommended to monitor hearing loss for progression.    32 minutes spent testing and counseling on results.   If you have  any questions please feel free to contact me at [phone].  Raynald Calkins Stalnaker Au.D.  Audiologist   05/19/2023  2:17 PM  Cc: Adelia Homestead, MD

## 2023-06-02 ENCOUNTER — Other Ambulatory Visit (HOSPITAL_COMMUNITY): Payer: Self-pay

## 2023-06-03 ENCOUNTER — Other Ambulatory Visit (HOSPITAL_COMMUNITY): Payer: Self-pay

## 2023-06-04 ENCOUNTER — Other Ambulatory Visit (HOSPITAL_COMMUNITY): Payer: Self-pay

## 2023-06-09 ENCOUNTER — Other Ambulatory Visit (HOSPITAL_COMMUNITY): Payer: Self-pay

## 2023-06-09 MED ORDER — DULOXETINE HCL 60 MG PO CPEP
60.0000 mg | ORAL_CAPSULE | Freq: Two times a day (BID) | ORAL | 0 refills | Status: DC
  Filled 2023-06-09: qty 60, 30d supply, fill #0

## 2023-06-09 MED ORDER — TRAZODONE HCL 50 MG PO TABS
50.0000 mg | ORAL_TABLET | Freq: Every day | ORAL | 0 refills | Status: DC
  Filled 2023-06-09: qty 60, 30d supply, fill #0

## 2023-06-12 ENCOUNTER — Other Ambulatory Visit (HOSPITAL_COMMUNITY): Payer: Self-pay

## 2023-06-12 MED ORDER — DULOXETINE HCL 60 MG PO CPEP
60.0000 mg | ORAL_CAPSULE | Freq: Two times a day (BID) | ORAL | 0 refills | Status: DC
  Filled 2023-06-12 – 2023-07-03 (×2): qty 180, 90d supply, fill #0

## 2023-06-12 MED ORDER — TRAZODONE HCL 50 MG PO TABS
50.0000 mg | ORAL_TABLET | Freq: Every evening | ORAL | 1 refills | Status: DC
  Filled 2023-06-12: qty 180, 180d supply, fill #0
  Filled 2023-07-03: qty 180, 90d supply, fill #0
  Filled 2023-10-18: qty 180, 90d supply, fill #1

## 2023-07-03 ENCOUNTER — Other Ambulatory Visit (HOSPITAL_COMMUNITY): Payer: Self-pay

## 2023-07-03 ENCOUNTER — Other Ambulatory Visit: Payer: Self-pay

## 2023-07-10 ENCOUNTER — Other Ambulatory Visit (HOSPITAL_COMMUNITY): Payer: Self-pay

## 2023-07-10 MED ORDER — TRIAMCINOLONE ACETONIDE 0.1 % EX CREA
TOPICAL_CREAM | Freq: Two times a day (BID) | CUTANEOUS | 0 refills | Status: AC
  Filled 2023-07-10: qty 30, 15d supply, fill #0

## 2023-07-11 ENCOUNTER — Other Ambulatory Visit (HOSPITAL_COMMUNITY): Payer: Self-pay

## 2023-07-11 MED ORDER — GABAPENTIN 600 MG PO TABS
600.0000 mg | ORAL_TABLET | Freq: Three times a day (TID) | ORAL | 0 refills | Status: DC
  Filled 2023-07-11: qty 90, 30d supply, fill #0

## 2023-07-11 MED ORDER — PREDNISONE 10 MG (21) PO TBPK
ORAL_TABLET | ORAL | 0 refills | Status: DC
  Filled 2023-07-11: qty 21, 6d supply, fill #0

## 2023-07-22 ENCOUNTER — Other Ambulatory Visit (HOSPITAL_COMMUNITY): Payer: Self-pay

## 2023-07-23 ENCOUNTER — Other Ambulatory Visit: Payer: Self-pay

## 2023-07-23 ENCOUNTER — Other Ambulatory Visit (HOSPITAL_COMMUNITY): Payer: Self-pay

## 2023-07-24 ENCOUNTER — Other Ambulatory Visit (HOSPITAL_COMMUNITY): Payer: Self-pay

## 2023-07-28 ENCOUNTER — Other Ambulatory Visit (HOSPITAL_COMMUNITY): Payer: Self-pay

## 2023-07-28 MED ORDER — ALPRAZOLAM 0.5 MG PO TABS
0.2500 mg | ORAL_TABLET | Freq: Every day | ORAL | 3 refills | Status: DC | PRN
  Filled 2023-07-28: qty 15, 30d supply, fill #0
  Filled 2023-09-21: qty 15, 30d supply, fill #1

## 2023-08-25 ENCOUNTER — Other Ambulatory Visit: Payer: Self-pay

## 2023-08-25 ENCOUNTER — Other Ambulatory Visit (HOSPITAL_COMMUNITY): Payer: Self-pay

## 2023-08-25 MED ORDER — GABAPENTIN 600 MG PO TABS
600.0000 mg | ORAL_TABLET | Freq: Three times a day (TID) | ORAL | 2 refills | Status: DC
  Filled 2023-08-25: qty 90, 30d supply, fill #0
  Filled 2023-09-21: qty 90, 30d supply, fill #1

## 2023-09-12 ENCOUNTER — Encounter (INDEPENDENT_AMBULATORY_CARE_PROVIDER_SITE_OTHER): Payer: Self-pay

## 2023-09-21 ENCOUNTER — Other Ambulatory Visit (HOSPITAL_COMMUNITY): Payer: Self-pay

## 2023-09-21 ENCOUNTER — Other Ambulatory Visit: Payer: Self-pay | Admitting: Pulmonary Disease

## 2023-09-21 DIAGNOSIS — J4540 Moderate persistent asthma, uncomplicated: Secondary | ICD-10-CM

## 2023-09-21 MED ORDER — BREZTRI AEROSPHERE 160-9-4.8 MCG/ACT IN AERO
2.0000 | INHALATION_SPRAY | Freq: Two times a day (BID) | RESPIRATORY_TRACT | 6 refills | Status: DC
  Filled 2023-09-21: qty 10.7, 30d supply, fill #0
  Filled 2023-10-18: qty 10.7, 30d supply, fill #1
  Filled 2023-12-16: qty 10.7, 30d supply, fill #2

## 2023-09-22 ENCOUNTER — Other Ambulatory Visit: Payer: Self-pay

## 2023-09-22 ENCOUNTER — Other Ambulatory Visit (HOSPITAL_COMMUNITY): Payer: Self-pay

## 2023-09-23 ENCOUNTER — Other Ambulatory Visit: Payer: Self-pay

## 2023-09-23 ENCOUNTER — Encounter (HOSPITAL_COMMUNITY): Payer: Self-pay

## 2023-09-23 ENCOUNTER — Other Ambulatory Visit (HOSPITAL_COMMUNITY): Payer: Self-pay

## 2023-10-05 ENCOUNTER — Other Ambulatory Visit (HOSPITAL_COMMUNITY): Payer: Self-pay

## 2023-10-06 ENCOUNTER — Other Ambulatory Visit (HOSPITAL_COMMUNITY): Payer: Self-pay

## 2023-10-06 MED ORDER — DULOXETINE HCL 60 MG PO CPEP
60.0000 mg | ORAL_CAPSULE | Freq: Two times a day (BID) | ORAL | 0 refills | Status: DC
  Filled 2023-10-06: qty 180, 90d supply, fill #0

## 2023-10-20 ENCOUNTER — Other Ambulatory Visit: Payer: Self-pay

## 2023-10-22 ENCOUNTER — Other Ambulatory Visit (HOSPITAL_COMMUNITY): Payer: Self-pay

## 2023-10-22 ENCOUNTER — Other Ambulatory Visit: Payer: Self-pay

## 2023-10-30 ENCOUNTER — Other Ambulatory Visit (HOSPITAL_COMMUNITY): Payer: Self-pay

## 2023-10-30 ENCOUNTER — Other Ambulatory Visit: Payer: Self-pay

## 2023-10-30 MED ORDER — CYCLOBENZAPRINE HCL 5 MG PO TABS
5.0000 mg | ORAL_TABLET | Freq: Three times a day (TID) | ORAL | 1 refills | Status: DC | PRN
  Filled 2023-10-30 (×2): qty 90, 30d supply, fill #0
  Filled 2023-12-16: qty 90, 30d supply, fill #1

## 2023-10-30 MED ORDER — DOCUSATE SODIUM 100 MG PO CAPS
100.0000 mg | ORAL_CAPSULE | Freq: Two times a day (BID) | ORAL | 2 refills | Status: DC | PRN
  Filled 2023-10-30 (×2): qty 30, 15d supply, fill #0
  Filled 2023-12-16: qty 30, 15d supply, fill #1

## 2023-10-30 MED ORDER — OXYCODONE-ACETAMINOPHEN 5-325 MG PO TABS
1.0000 | ORAL_TABLET | ORAL | 0 refills | Status: DC | PRN
  Filled 2023-10-30: qty 20, 5d supply, fill #0
  Filled 2023-10-30: qty 20, 4d supply, fill #0

## 2023-11-24 ENCOUNTER — Other Ambulatory Visit (HOSPITAL_BASED_OUTPATIENT_CLINIC_OR_DEPARTMENT_OTHER): Payer: Self-pay

## 2023-11-24 MED ORDER — COMIRNATY 30 MCG/0.3ML IM SUSY
0.3000 mL | PREFILLED_SYRINGE | Freq: Once | INTRAMUSCULAR | 0 refills | Status: AC
Start: 2023-11-24 — End: 2023-11-25
  Filled 2023-11-24: qty 0.3, 1d supply, fill #0

## 2023-11-24 MED ORDER — FLUZONE HIGH-DOSE 0.5 ML IM SUSY
0.5000 mL | PREFILLED_SYRINGE | Freq: Once | INTRAMUSCULAR | 0 refills | Status: AC
  Filled 2023-11-24: qty 0.5, 1d supply, fill #0

## 2023-12-09 ENCOUNTER — Other Ambulatory Visit (HOSPITAL_COMMUNITY): Payer: Self-pay

## 2023-12-09 MED ORDER — DULOXETINE HCL 60 MG PO CPEP
60.0000 mg | ORAL_CAPSULE | Freq: Two times a day (BID) | ORAL | 1 refills | Status: AC
  Filled 2023-12-09 – 2023-12-16 (×2): qty 180, 90d supply, fill #0

## 2023-12-09 MED ORDER — ALPRAZOLAM 0.5 MG PO TABS
0.2500 mg | ORAL_TABLET | Freq: Every day | ORAL | 5 refills | Status: AC | PRN
  Filled 2023-12-09: qty 10, 30d supply, fill #0

## 2023-12-09 MED ORDER — TRAZODONE HCL 50 MG PO TABS
50.0000 mg | ORAL_TABLET | Freq: Every evening | ORAL | 1 refills | Status: AC
  Filled 2023-12-09 – 2024-01-11 (×3): qty 90, 90d supply, fill #0

## 2023-12-16 ENCOUNTER — Other Ambulatory Visit (HOSPITAL_COMMUNITY): Payer: Self-pay

## 2023-12-16 ENCOUNTER — Other Ambulatory Visit: Payer: Self-pay

## 2023-12-30 ENCOUNTER — Encounter: Payer: Self-pay | Admitting: Pulmonary Disease

## 2023-12-30 ENCOUNTER — Other Ambulatory Visit: Payer: Self-pay

## 2023-12-30 ENCOUNTER — Ambulatory Visit: Payer: PRIVATE HEALTH INSURANCE | Admitting: Pulmonary Disease

## 2023-12-30 ENCOUNTER — Other Ambulatory Visit (HOSPITAL_COMMUNITY): Payer: Self-pay

## 2023-12-30 VITALS — BP 117/62 | HR 81 | Ht 65.0 in | Wt 164.4 lb

## 2023-12-30 DIAGNOSIS — R079 Chest pain, unspecified: Secondary | ICD-10-CM

## 2023-12-30 DIAGNOSIS — J4540 Moderate persistent asthma, uncomplicated: Secondary | ICD-10-CM

## 2023-12-30 MED ORDER — ALBUTEROL SULFATE HFA 108 (90 BASE) MCG/ACT IN AERS
2.0000 | INHALATION_SPRAY | RESPIRATORY_TRACT | 3 refills | Status: AC | PRN
  Filled 2023-12-30: qty 6.7, 25d supply, fill #0
  Filled 2023-12-30: qty 6.7, 16d supply, fill #0

## 2023-12-30 MED ORDER — BREZTRI AEROSPHERE 160-9-4.8 MCG/ACT IN AERO
2.0000 | INHALATION_SPRAY | Freq: Two times a day (BID) | RESPIRATORY_TRACT | 11 refills | Status: AC
  Filled 2023-12-30 – 2024-01-11 (×2): qty 10.7, 30d supply, fill #0
  Filled 2024-02-10 (×2): qty 10.7, 30d supply, fill #1

## 2023-12-30 NOTE — Patient Instructions (Signed)
 Continue Breztri  inhaler 2 puffs twice daily - rinse mouth out after each use  Use albuterol  inhaler 1-2 puffs as needed  We will check a d-dimer lab today to evaluate for possible blood clot after surgery  Follow up in 1 year, call sooner if needed

## 2023-12-30 NOTE — Assessment & Plan Note (Addendum)
  Orders:   budesonide -glycopyrrolate -formoterol  (BREZTRI  AEROSPHERE) 160-9-4.8 MCG/ACT AERO inhaler; Inhale 2 puffs into the lungs in the morning and at bedtime.   albuterol  (VENTOLIN  HFA) 108 (90 Base) MCG/ACT inhaler; Inhale 2 puffs into the lungs every 4 (four) hours as needed for wheezing or shortness of breath

## 2023-12-30 NOTE — Progress Notes (Signed)
 Established Patient Pulmonology Office Visit   Subjective:  Patient ID: Gina Graves, female    DOB: 22-Feb-1955  MRN: [id number]  CC:  Chief Complaint  Patient presents with   Medical Management of Chronic Issues    Pt states had a cold not to long ago noticed has some SOB sats were good, tired     Discussed the use of AI scribe software for clinical note transcription with the patient, who gave verbal consent to proceed.  History of Present Illness Gina Graves is a 68 year old woman, never smokwer with hiatal hernia, GERD and asthma who returns to pulmonary clinic for asthma.   Approximately two months ago, she underwent cervical fusion surgery and had significant postoperative congestion that required deep breathing and coughing. As the congestion cleared, she developed chest and mid-back pain, which she thought might be muscle spasms.  About two weeks ago, she developed a cold with fatigue and shortness of breath at rest. Her home oxygen saturation was 93%. The cold symptoms have resolved, but she still has internal chest and mid-back pain with deep inspiration that does not change with palpation.  She denies dysphagia, food sticking, or worsening reflux. She uses a rescue inhaler occasionally and has used it a few times over the past couple of months for dyspnea or exertion. She uses Breztri  for respiratory issues. She is not on blood thinners and denies leg swelling or leg pain. She retired recently and is caring for her husband at home.        ROS    Current Outpatient Medications:    Acetylcysteine (N-ACETYL-L-CYSTEINE) 600 MG CAPS, Take by mouth., Disp: , Rfl:    ALPRAZolam  (XANAX ) 0.5 MG tablet, Take 1/2-1 tablet (0.25-0.5 mg total) by mouth daily as needed for severe anxiety or panic. Must last 30 days., Disp: 10 tablet, Rfl: 5   cetirizine (ZYRTEC) 10 MG tablet, Take 10 mg by mouth daily., Disp: , Rfl:    Cholecalciferol (VITAMIN D3) 2000 UNITS TABS, Take  2,000 Units by mouth daily as needed (vitamin supplement). , Disp: , Rfl:    DULoxetine  (CYMBALTA ) 60 MG capsule, Take 1 capsule (60 mg total) by mouth 2 (two) times daily., Disp: 180 capsule, Rfl: 1   fluticasone  (FLONASE ) 50 MCG/ACT nasal spray, Place 1 spray into both nostrils 2 (two) times daily as needed for allergies or rhinitis (for sinus congestion)., Disp: 48 g, Rfl: 3   ibuprofen  (ADVIL ) 800 MG tablet, Take 1 tablet (800 mg total) by mouth every 8 (eight) hours as needed., Disp: 90 tablet, Rfl: 1   montelukast  (SINGULAIR ) 10 MG tablet, Take 1 tablet (10 mg total) by mouth daily. NEEDS OFFICE VISIT FOR FURTHER REFILLS., Disp: 90 tablet, Rfl: 3   nystatin -triamcinolone  ointment (MYCOLOG), Apply 1 Application topically 2 (two) times daily., Disp: 100 g, Rfl: 3   omeprazole  (PRILOSEC) 20 MG capsule, Take 1 capsule (20 mg total) by mouth 2 (two) times daily before a meal., Disp: 180 capsule, Rfl: 3   pravastatin  (PRAVACHOL ) 20 MG tablet, Take 1 tablet (20 mg total) by mouth daily., Disp: 90 tablet, Rfl: 3   traZODone  (DESYREL ) 50 MG tablet, Take 1 tablet (50 mg total) by mouth at bedtime., Disp: 90 tablet, Rfl: 1   triamcinolone  cream (KENALOG ) 0.1 %, Apply 1 application topically 2 (two) times daily., Disp: 30 g, Rfl: 0   TURMERIC PO, Take 1 tablet by mouth daily as needed (supplement). , Disp: , Rfl:    valACYclovir  (VALTREX )  500 MG tablet, Take 1 tablet (500 mg total) by mouth daily., Disp: 90 tablet, Rfl: 3   albuterol  (VENTOLIN  HFA) 108 (90 Base) MCG/ACT inhaler, Inhale 2 puffs into the lungs every 4 (four) hours as needed for wheezing or shortness of breath, Disp: 6.7 g, Rfl: 3   budesonide -glycopyrrolate -formoterol  (BREZTRI  AEROSPHERE) 160-9-4.8 MCG/ACT AERO inhaler, Inhale 2 puffs into the lungs in the morning and at bedtime., Disp: 10.7 g, Rfl: 11      Objective:  BP 117/62   Pulse 81   Ht 5' 5 (1.651 m) Comment: per pt  Wt 164 lb 6.4 oz (74.6 kg)   SpO2 97%   BMI 27.36 kg/m      Physical Exam Constitutional:      General: She is not in acute distress.    Appearance: Normal appearance.  Eyes:     General: No scleral icterus.    Conjunctiva/sclera: Conjunctivae normal.  Cardiovascular:     Rate and Rhythm: Normal rate and regular rhythm.  Pulmonary:     Breath sounds: No wheezing, rhonchi or rales.  Musculoskeletal:     Right lower leg: No edema.     Left lower leg: No edema.  Skin:    General: Skin is warm and dry.  Neurological:     General: No focal deficit present.    CTA Chest 06/19/22 1. Nonaneurysmal thoracic aorta, measuring 3.8 cm in greatest dimension. 2. 2.1 cm LEFT inferior pole thyroid  nodule. Recommend non-emergent dedicated thyroid  ultrasound. Reference: J Am Coll Radiol. 2015 Feb;12(2): 143-50 3. Solitary 4 mm LEFT apical pulmonary nodule. Per Fleischner Society Guidelines, no routine follow-up imaging is recommended.   Diagnostic Review:  Last CBC Lab Results  Component Value Date   WBC 5.3 04/28/2023   HGB 14.2 04/28/2023   HCT 42.6 04/28/2023   MCV 95.4 04/28/2023   RDW 13.5 04/28/2023   PLT 318.0 04/28/2023   Last metabolic panel Lab Results  Component Value Date   GLUCOSE 88 04/28/2023   NA 141 04/28/2023   K 4.0 04/28/2023   CL 104 04/28/2023   CO2 29 04/28/2023   BUN 15 04/28/2023   CREATININE 0.81 04/28/2023   GFR 75.86 04/28/2023   CALCIUM 9.6 04/28/2023   PROT 7.0 04/28/2023   ALBUMIN 4.8 04/28/2023   BILITOT 0.5 04/28/2023   ALKPHOS 48 04/28/2023   AST 13 04/28/2023   ALT 10 04/28/2023       Assessment & Plan:   Assessment & Plan Moderate persistent asthma without complication  Orders:   budesonide -glycopyrrolate -formoterol  (BREZTRI  AEROSPHERE) 160-9-4.8 MCG/ACT AERO inhaler; Inhale 2 puffs into the lungs in the morning and at bedtime.   albuterol  (VENTOLIN  HFA) 108 (90 Base) MCG/ACT inhaler; Inhale 2 puffs into the lungs every 4 (four) hours as needed for wheezing or shortness of  breath  Chest pain, unspecified type  Orders:   D-Dimer, Quantitative; Future   Assessment and Plan Assessment & Plan Chest pain  Intermittent chest pain post-cervical fusion surgery, exacerbated by deep breathing. Differential includes musculoskeletal pain, pulmonary embolism, and post-surgical complications. Concern for pulmonary embolism due to recent surgery and prolonged recovery from respiratory symptoms. - Ordered D-dimer to evaluate for pulmonary embolism. - If D-dimer elevated, consider CTA of the chest. - Advised chest stretching exercises for musculoskeletal discomfort.  Moderate persistent asthma Asthma managed with Breztri  and albuterol  as needed. No recent exacerbations, occasional use of rescue inhaler during exertion or shortness of breath. - Continue Breztri  with refills. - Refilled albuterol  inhaler for as-needed  use.      Return in about 1 year (around 12/29/2024).   Dorn KATHEE Chill, MD

## 2023-12-31 ENCOUNTER — Other Ambulatory Visit (HOSPITAL_COMMUNITY): Payer: Self-pay

## 2023-12-31 ENCOUNTER — Ambulatory Visit: Payer: Self-pay | Admitting: Pulmonary Disease

## 2023-12-31 LAB — D-DIMER, QUANTITATIVE: D-Dimer, Quant: 0.51 ug{FEU}/mL — ABNORMAL HIGH (ref <0.50)

## 2024-01-12 ENCOUNTER — Other Ambulatory Visit (HOSPITAL_COMMUNITY): Payer: Self-pay

## 2024-01-12 ENCOUNTER — Other Ambulatory Visit: Payer: Self-pay

## 2024-01-18 ENCOUNTER — Other Ambulatory Visit (HOSPITAL_COMMUNITY): Payer: Self-pay

## 2024-02-10 ENCOUNTER — Other Ambulatory Visit: Payer: Self-pay

## 2024-02-10 ENCOUNTER — Other Ambulatory Visit (HOSPITAL_COMMUNITY): Payer: Self-pay

## 2024-02-10 ENCOUNTER — Encounter: Payer: Self-pay | Admitting: Pharmacist

## 2024-02-11 ENCOUNTER — Other Ambulatory Visit (HOSPITAL_COMMUNITY): Payer: Self-pay

## 2024-03-27 IMAGING — CT CT CARDIAC CORONARY ARTERY CALCIUM SCORE
3 series · 12 of 20 positions shown, 14 images · non-contrast
Comparison: CT 12/27/2020.

Addendum:
CLINICAL DATA: Risk stratification: 66 Year-old White Female

EXAM:
Coronary Calcium Score
TECHNIQUE: The patient was scanned on a Siemens Force scanner. Axial
non-contrast 3 mm slices were carried out through the heart. The
data set was analyzed on a dedicated work station and scored using
the Agatson method.

[Series 2: cascseq 2.0 sa36 (id) (id) · axial · 0.39mm/px · z∈[-204,-124]mm · 4 of 68 slices shown]
[im 14/68  vessel]
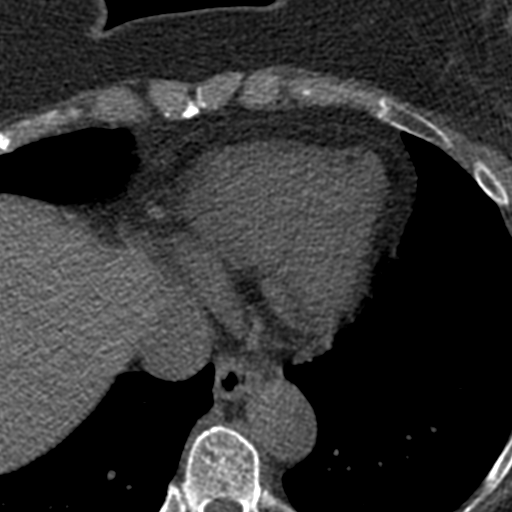
[im 27/68  vessel]
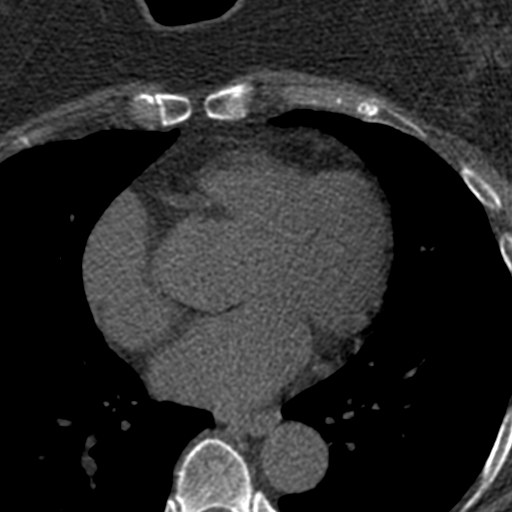
[im 41/68  vessel]
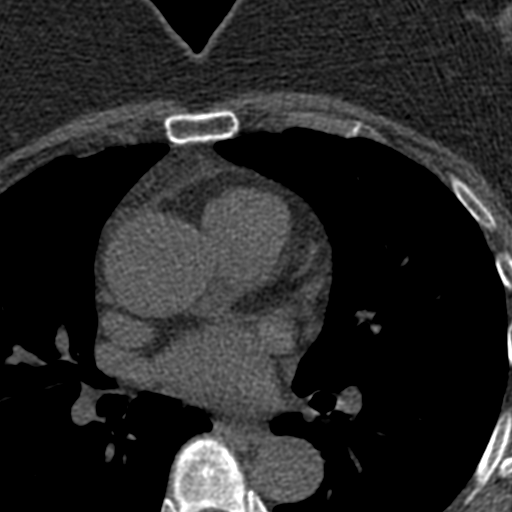
[im 54/68  vessel]
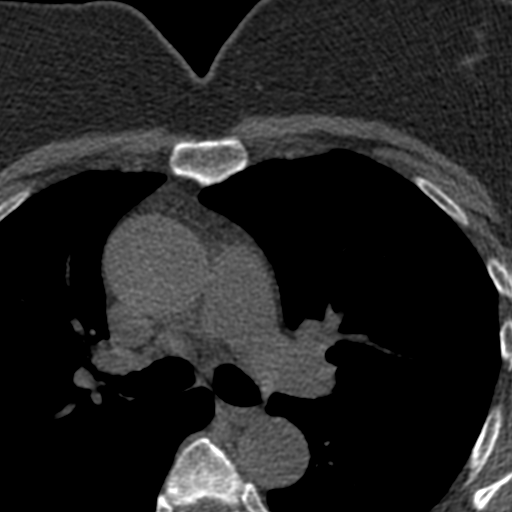

[Series 3: cascseq 2.0 bf37 st · axial · 0.70mm/px · z∈[-208,-120]mm · 5 of 68 slices shown, 7 images]
[im 12/68  vessel]
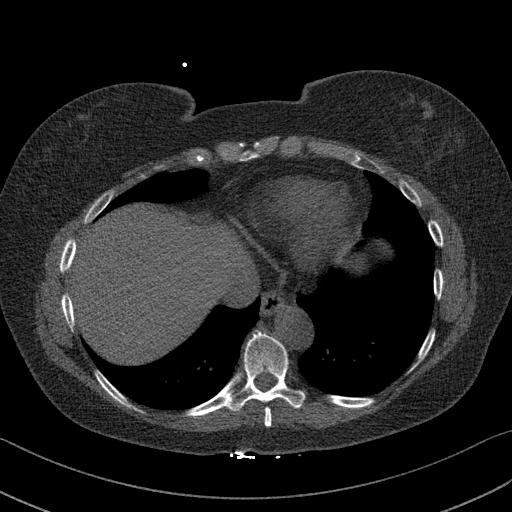
[im 12/68  lung]
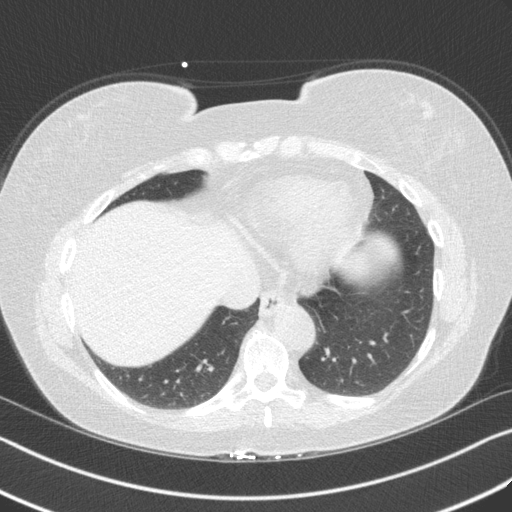
[im 23/68  vessel]
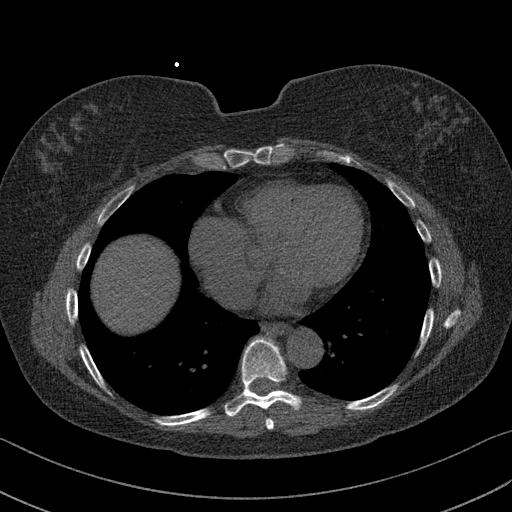
[im 34/68  vessel]
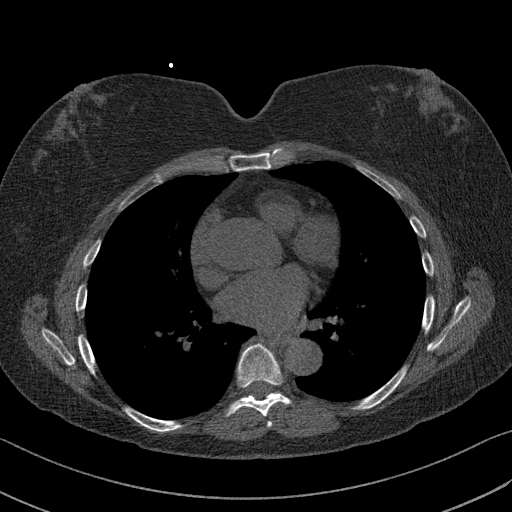
[im 45/68  vessel]
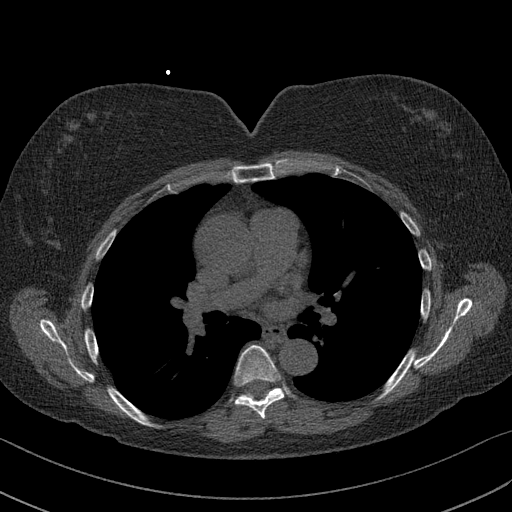
[im 56/68  vessel]
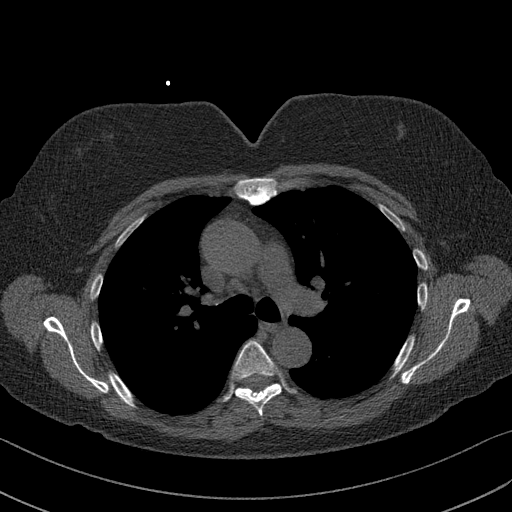
[im 56/68  lung]
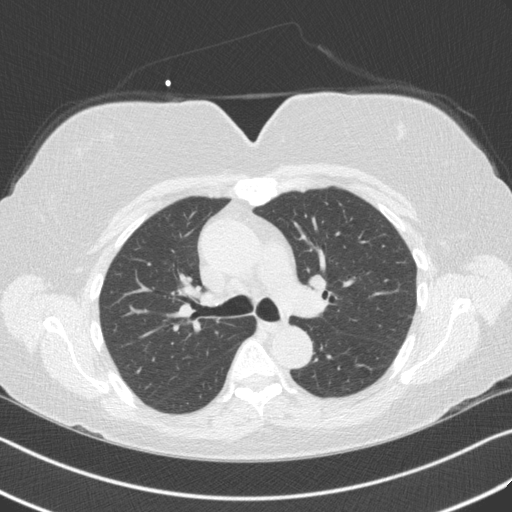

[Series 4: cascseq 2.0 br59 lung · axial · 0.70mm/px · z∈[-208,-164]mm · 3 of 68 slices shown]
[im 12/68  lung]
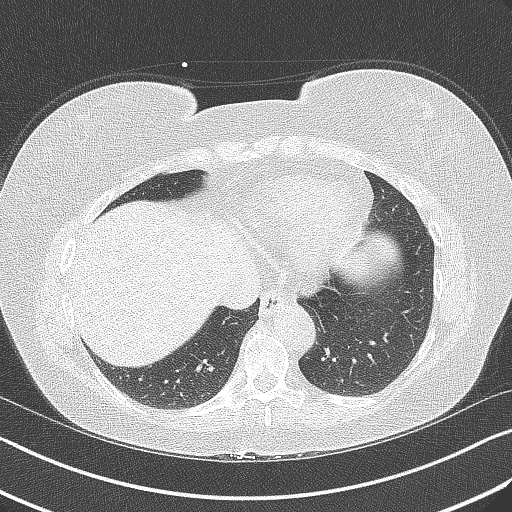
[im 23/68  lung]
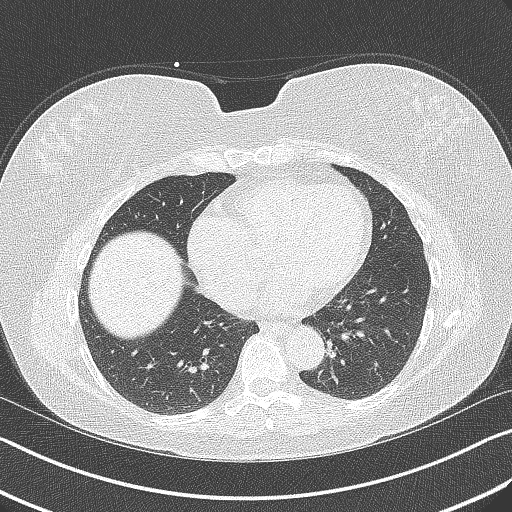
[im 34/68  lung]
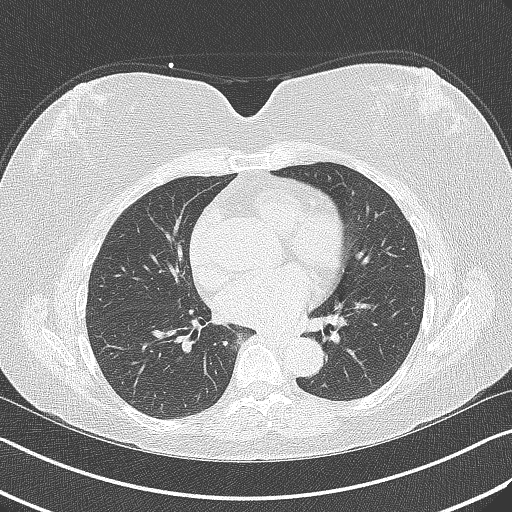

[12 of 20 positions shown; findings below may reference images not displayed]

FINDINGS: Non-cardiac: See separate report from [REDACTED].

Ascending Aorta: Mildly dilated 41 mm on non-contrasted study.
Aortic atherosclerosis.

Aortic Valve Calcium score: 0

Mitral annular calcification: None

Pericardium: Normal.

Coronary arteries: Normal origins.

Coronary Calcium Score:

Left main: 0

Left anterior descending artery: 0

Left circumflex artery: 1

Right coronary artery: 0

Total: 1

Percentile: 55th for age, sex, and race matched control.
IMPRESSION: 1. Coronary calcium score of 1. This was 55th percentile for age,
gender, and race matched controls.

2. Aortic atherosclerosis.

3. Evidence of mild ascending aortic dilation 41 mm on
non-contrasted study. Consider one year secondary imaging modality
(echocardiogram, CTA Aorta Protocol, MRA Aorta Protocol) if
clinically indicated. In prior chest imaging (5255 Chest CT),
maximal diameter only 39 mm (contrast study).

RECOMMENDATIONS:



If CAC = 0, it is reasonable to withhold statin therapy and reassess
in 5 to 10 years, as long as higher risk conditions are absent
(diabetes mellitus, family history of premature CHD in first degree
relatives (males <55 years; females <65 years), cigarette smoking,
LDL >=190 mg/dL or other independent risk factors).

If CAC is 1 to 99, it is reasonable to initiate statin therapy for
patients >=55 years of age.

If CAC is >=100 or >=75th percentile, it is reasonable to initiate
statin therapy at any age.

Cardiology referral should be considered for patients with CAC
scores =400 or >=75th percentile.

*0889 AHA/ACC/AACVPR/AAPA/ABC/OLOGO/OXENDINE/ZEPLIN/Hinco/BUNTING/JALVER/THACH
Guideline on the Management of Blood Cholesterol: A Report of the
American College of Cardiology/American Heart Association Task Force
on Clinical Practice Guidelines. J Am Coll Cardiol.
1341;73(24):3060-3861.

EXAM:
OVER-READ INTERPRETATION  CT CHEST

The following report is an over-read performed by radiologist Dr.
does not include interpretation of cardiac or coronary anatomy or
pathology. The coronary calcium score interpretation by the
cardiologist is attached.
FINDINGS: Vascular: Ascending aorta measures up to 4.1 cm. No other extra
cardiovascular findings.

Mediastinum/Nodes: No lymphadenopathy.

Lungs/Pleura: No focal airspace disease. Stable 2 mm subpleural left
upper lobe nodule. No follow-up needed if patient is low-risk.This
recommendation follows the consensus statement: Guidelines for
Management of Incidental Pulmonary Nodules Detected on CT Images:

Upper Abdomen: No acute abnormality.

Musculoskeletal: No acute osseous abnormality. No suspicious lytic
or blastic lesions. There is degenerative change at the
sternomanubrial joint.
IMPRESSION: Mildly dilated ascending aorta measuring up to 4.1 cm. Recommend
annual imaging followup by CTA or MRA. This recommendation follows
0161 ACCF/AHA/AATS/ACR/ASA/SCA/OLGAH/THEO/TEDUASHUM/MOSTUE Guidelines for the
Diagnosis and Management of Patients with Thoracic Aortic Disease.
Circulation. 0161; 121: [id number]. Aortic aneurysm NOS ([id number])

Stable 2 mm subpleural left upper lobe nodule. No follow-up needed
if patient is low-risk.This recommendation follows the consensus
statement: Guidelines for Management of Incidental Pulmonary Nodules
Detected on CT Images: From the [HOSPITAL] 0763; Radiology
0763; [DATE].

*** End of Addendum ***
FINDINGS: Non-cardiac: See separate report from [REDACTED].

Ascending Aorta: Mildly dilated 41 mm on non-contrasted study.
Aortic atherosclerosis.

Aortic Valve Calcium score: 0

Mitral annular calcification: None

Pericardium: Normal.

Coronary arteries: Normal origins.

Coronary Calcium Score:

Left main: 0

Left anterior descending artery: 0

Left circumflex artery: 1

Right coronary artery: 0

Total: 1

Percentile: 55th for age, sex, and race matched control.
IMPRESSION: 1. Coronary calcium score of 1. This was 55th percentile for age,
gender, and race matched controls.

2. Aortic atherosclerosis.

3. Evidence of mild ascending aortic dilation 41 mm on
non-contrasted study. Consider one year secondary imaging modality
(echocardiogram, CTA Aorta Protocol, MRA Aorta Protocol) if
clinically indicated. In prior chest imaging (5255 Chest CT),
maximal diameter only 39 mm (contrast study).

RECOMMENDATIONS:



If CAC = 0, it is reasonable to withhold statin therapy and reassess
in 5 to 10 years, as long as higher risk conditions are absent
(diabetes mellitus, family history of premature CHD in first degree
relatives (males <55 years; females <65 years), cigarette smoking,
LDL >=190 mg/dL or other independent risk factors).

If CAC is 1 to 99, it is reasonable to initiate statin therapy for
patients >=55 years of age.

If CAC is >=100 or >=75th percentile, it is reasonable to initiate
statin therapy at any age.

Cardiology referral should be considered for patients with CAC
scores =400 or >=75th percentile.

*0889 AHA/ACC/AACVPR/AAPA/ABC/OLOGO/OXENDINE/ZEPLIN/Hinco/BUNTING/JALVER/THACH
Guideline on the Management of Blood Cholesterol: A Report of the
American College of Cardiology/American Heart Association Task Force
on Clinical Practice Guidelines. J Am Coll Cardiol.
1341;73(24):3060-3861.
